# Patient Record
Sex: Female | Born: 1960
Health system: Southern US, Community
[De-identification: ages and names within clinical notes are randomized; demographics above are authoritative.]

## PROBLEM LIST (undated history)

## (undated) DIAGNOSIS — C801 Malignant (primary) neoplasm, unspecified: Secondary | ICD-10-CM

## (undated) HISTORY — PX: ABDOMINAL HYSTERECTOMY: SHX81

---

## 2015-04-12 DIAGNOSIS — R87612 Low grade squamous intraepithelial lesion on cytologic smear of cervix (LGSIL): Secondary | ICD-10-CM | POA: Insufficient documentation

## 2015-07-20 DIAGNOSIS — Z87440 Personal history of urinary (tract) infections: Secondary | ICD-10-CM | POA: Insufficient documentation

## 2017-10-02 DIAGNOSIS — R87810 Cervical high risk human papillomavirus (HPV) DNA test positive: Secondary | ICD-10-CM | POA: Insufficient documentation

## 2017-11-12 DIAGNOSIS — Z803 Family history of malignant neoplasm of breast: Secondary | ICD-10-CM | POA: Insufficient documentation

## 2017-11-28 DIAGNOSIS — Z800 Family history of malignant neoplasm of digestive organs: Secondary | ICD-10-CM | POA: Insufficient documentation

## 2019-03-27 DIAGNOSIS — C541 Malignant neoplasm of endometrium: Secondary | ICD-10-CM | POA: Insufficient documentation

## 2019-03-27 DIAGNOSIS — C530 Malignant neoplasm of endocervix: Secondary | ICD-10-CM | POA: Insufficient documentation

## 2020-03-22 DIAGNOSIS — D2261 Melanocytic nevi of right upper limb, including shoulder: Secondary | ICD-10-CM | POA: Diagnosis not present

## 2020-04-05 DIAGNOSIS — B078 Other viral warts: Secondary | ICD-10-CM | POA: Diagnosis not present

## 2020-04-05 DIAGNOSIS — Z85828 Personal history of other malignant neoplasm of skin: Secondary | ICD-10-CM | POA: Diagnosis not present

## 2020-04-05 DIAGNOSIS — C541 Malignant neoplasm of endometrium: Secondary | ICD-10-CM | POA: Diagnosis not present

## 2020-04-05 DIAGNOSIS — L820 Inflamed seborrheic keratosis: Secondary | ICD-10-CM | POA: Diagnosis not present

## 2020-04-05 DIAGNOSIS — N952 Postmenopausal atrophic vaginitis: Secondary | ICD-10-CM | POA: Diagnosis not present

## 2020-06-02 DIAGNOSIS — Z20822 Contact with and (suspected) exposure to covid-19: Secondary | ICD-10-CM | POA: Diagnosis not present

## 2020-06-02 DIAGNOSIS — Z7184 Encounter for health counseling related to travel: Secondary | ICD-10-CM | POA: Diagnosis not present

## 2020-08-02 DIAGNOSIS — C541 Malignant neoplasm of endometrium: Secondary | ICD-10-CM | POA: Diagnosis not present

## 2020-08-02 DIAGNOSIS — N952 Postmenopausal atrophic vaginitis: Secondary | ICD-10-CM | POA: Diagnosis not present

## 2020-08-02 DIAGNOSIS — N9419 Other specified dyspareunia: Secondary | ICD-10-CM | POA: Diagnosis not present

## 2020-09-06 DIAGNOSIS — Z01419 Encounter for gynecological examination (general) (routine) without abnormal findings: Secondary | ICD-10-CM | POA: Diagnosis not present

## 2020-09-06 DIAGNOSIS — Z779 Other contact with and (suspected) exposures hazardous to health: Secondary | ICD-10-CM | POA: Diagnosis not present

## 2020-09-06 LAB — HM PAP SMEAR: HM Pap smear: NEGATIVE

## 2020-12-13 DIAGNOSIS — C541 Malignant neoplasm of endometrium: Secondary | ICD-10-CM | POA: Diagnosis not present

## 2021-03-07 ENCOUNTER — Emergency Department
Admission: EM | Admit: 2021-03-07 | Discharge: 2021-03-07 | Disposition: A | Discharge status: Home / Self Care | Payer: Self-pay | Source: Home / Self Care

## 2021-03-07 DIAGNOSIS — R030 Elevated blood-pressure reading, without diagnosis of hypertension: Secondary | ICD-10-CM | POA: Diagnosis not present

## 2021-03-07 HISTORY — DX: Malignant (primary) neoplasm, unspecified: C80.1

## 2021-03-07 NOTE — Discharge Instructions (Addendum)
Advised/encouraged patient to check blood pressure twice daily (once in the morning prior to breakfast and once in the evening prior to dinner) and to log measurements for the next 7 days so that new PCP may be able to better evaluate daily blood pressure trends.

## 2021-03-07 NOTE — ED Triage Notes (Addendum)
Pt c/o hypertension which she's noticed since last week after experiencing some dizziness, HA and tingling in feet. At 0750 this am, BP was  160/95 RT in LT arm and 137/90 in RT. Says it did come down when she took it again at [place]. Was feeling some dizziness which prompted her to take her BP. No previous hx of hypertension. Currently does not have a PCP. Has appt to est care next week.

## 2021-03-07 NOTE — ED Provider Notes (Signed)
[facility]    CSN: [id number] Arrival date & time: 03/07/21  0951      History   Chief Complaint Chief Complaint  Patient presents with   Hypertension    HPI Mathea Azlin is a 60 y.o. female.   HPI 60 year old female presents with increased blood pressure for 7 days.  Reports experiencing some dizziness, headache, and tingling in her feet at 750 this morning with BP in the right arm 160/95, then 137/90.  Patient has no history of hypertension, currently does not have PCP; however, has appointment set for next week to establish care with provider.  Past Medical History:  Diagnosis Date   Cancer ([place])     There are no problems to display for this patient.   Past Surgical History:  Procedure Laterality Date   ABDOMINAL HYSTERECTOMY      OB History   No obstetric history on file.      Home Medications    Prior to Admission medications   Medication Sig Start Date End Date Taking? Authorizing Provider  venlafaxine XR (EFFEXOR-XR) 75 MG 24 hr capsule Take by mouth. 02/09/15  Yes [provider]    Family History History reviewed. No pertinent family history.  Social History Social History   Tobacco Use   Smoking status: Never   Smokeless tobacco: Never  Vaping Use   Vaping Use: Never used  Substance Use Topics   Alcohol use: Yes    Comment: occ     Allergies   Tylenol [acetaminophen]   Review of Systems Review of Systems  Cardiovascular:        Elevated blood pressure for 1-2 weeks    Physical Exam Triage Vital Signs ED Triage Vitals  Enc Vitals Group     BP 03/07/21 1013 137/85     Pulse Rate 03/07/21 1013 65     Resp 03/07/21 1013 17     Temp 03/07/21 1013 98.5 F (36.9 C)     Temp Source 03/07/21 1013 Oral     SpO2 03/07/21 1013 97 %     Weight --      Height --      Head Circumference --      Peak Flow --      Pain Score 03/07/21 1010 0     Pain Loc --      Pain Edu? --      Excl. in [place]? --    No data  found.  Updated Vital Signs BP 137/85 (BP Location: Right Arm)   Pulse 65   Temp 98.5 F (36.9 C) (Oral)   Resp 17   SpO2 97%       Physical Exam Vitals and nursing note reviewed.  Constitutional:      General: She is not in acute distress.    Appearance: Normal appearance. She is normal weight. She is not ill-appearing.  HENT:     Head: Normocephalic and atraumatic.     Mouth/Throat:     Mouth: Mucous membranes are moist.     Pharynx: Oropharynx is clear.  Eyes:     Extraocular Movements: Extraocular movements intact.     Conjunctiva/sclera: Conjunctivae normal.     Pupils: Pupils are equal, round, and reactive to light.  Neck:     Comments: No JVD, no bruit Cardiovascular:     Rate and Rhythm: Normal rate and regular rhythm.     Pulses: Normal pulses.     Heart sounds: Normal heart sounds. No murmur  heard.   No friction rub. No gallop.  Pulmonary:     Effort: Pulmonary effort is normal.     Breath sounds: No stridor. No wheezing, rhonchi or rales.  Musculoskeletal:        General: Normal range of motion.     Cervical back: Normal range of motion and neck supple.  Skin:    General: Skin is warm and dry.  Neurological:     General: No focal deficit present.     Mental Status: She is alert and oriented to person, place, and time. Mental status is at baseline.  Psychiatric:        Mood and Affect: Mood normal.        Behavior: Behavior normal.        Thought Content: Thought content normal.     UC Treatments / Results  Labs (all labs ordered are listed, but only abnormal results are displayed) Labs Reviewed - No data to display  EKG   Radiology No results found.  Procedures Procedures (including critical care time)  Medications Ordered in UC Medications - No data to display  Initial Impression / Assessment and Plan / UC Course  I have reviewed the triage vital signs and the nursing notes.  Pertinent labs & imaging results that were available during  my care of the patient were reviewed by me and considered in my medical decision making (see chart for details).     MDM: 1.  Elevated blood pressure reading without diagnosis of hypertension-Advised/encouraged patient to check blood pressure twice daily (once in the morning prior to breakfast and once in the evening prior to dinner) and to log measurements for the next 7 days so that new PCP may be able to better evaluate daily blood pressure trends.  Patient discharged home, hemodynamically stable. Final Clinical Impressions(s) / UC Diagnoses   Final diagnoses:  Elevated blood pressure reading without diagnosis of hypertension     Discharge Instructions      Advised/encouraged patient to check blood pressure twice daily (once in the morning prior to breakfast and once in the evening prior to dinner) and to log measurements for the next 7 days so that new PCP may be able to better evaluate daily blood pressure trends.     ED Prescriptions   None    PDMP not reviewed this encounter.   Eliezer Lofts, [place] 03/07/21 1135

## 2021-03-13 DIAGNOSIS — E785 Hyperlipidemia, unspecified: Secondary | ICD-10-CM | POA: Insufficient documentation

## 2021-03-13 DIAGNOSIS — Z79899 Other long term (current) drug therapy: Secondary | ICD-10-CM | POA: Insufficient documentation

## 2021-03-13 DIAGNOSIS — N951 Menopausal and female climacteric states: Secondary | ICD-10-CM | POA: Insufficient documentation

## 2021-03-14 ENCOUNTER — Encounter: Payer: Self-pay | Admitting: Physician Assistant

## 2021-03-14 ENCOUNTER — Other Ambulatory Visit: Payer: Self-pay

## 2021-03-14 ENCOUNTER — Ambulatory Visit (INDEPENDENT_AMBULATORY_CARE_PROVIDER_SITE_OTHER): Payer: BC Managed Care – PPO | Admitting: Physician Assistant

## 2021-03-14 VITALS — BP 144/78 | HR 56 | Ht 59.0 in | Wt 136.0 lb

## 2021-03-14 DIAGNOSIS — Z780 Asymptomatic menopausal state: Secondary | ICD-10-CM

## 2021-03-14 DIAGNOSIS — Z8262 Family history of osteoporosis: Secondary | ICD-10-CM

## 2021-03-14 DIAGNOSIS — R519 Headache, unspecified: Secondary | ICD-10-CM

## 2021-03-14 DIAGNOSIS — N951 Menopausal and female climacteric states: Secondary | ICD-10-CM

## 2021-03-14 DIAGNOSIS — R202 Paresthesia of skin: Secondary | ICD-10-CM

## 2021-03-14 DIAGNOSIS — I10 Essential (primary) hypertension: Secondary | ICD-10-CM

## 2021-03-14 DIAGNOSIS — Z13220 Encounter for screening for lipoid disorders: Secondary | ICD-10-CM | POA: Diagnosis not present

## 2021-03-14 DIAGNOSIS — Z1211 Encounter for screening for malignant neoplasm of colon: Secondary | ICD-10-CM

## 2021-03-14 DIAGNOSIS — Z131 Encounter for screening for diabetes mellitus: Secondary | ICD-10-CM | POA: Diagnosis not present

## 2021-03-14 DIAGNOSIS — R7989 Other specified abnormal findings of blood chemistry: Secondary | ICD-10-CM

## 2021-03-14 DIAGNOSIS — R42 Dizziness and giddiness: Secondary | ICD-10-CM

## 2021-03-14 DIAGNOSIS — Z1231 Encounter for screening mammogram for malignant neoplasm of breast: Secondary | ICD-10-CM

## 2021-03-14 MED ORDER — HYDROCHLOROTHIAZIDE 12.5 MG PO TABS: 12.5000 mg | ORAL_TABLET | Freq: Every day | ORAL | 1 refills | Status: DC

## 2021-03-14 NOTE — Progress Notes (Signed)
New Patient Office Visit  Subjective:  Patient ID: Kathleen Smith, female    DOB: Jul 23, 1960  Age: 60 y.o. MRN: [id number]  CC: No chief complaint on file.  HPI Kathleen Smith presents to establish care.   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 She has noticed her BP has been elevated for the past 2 weeks. She also had an episode Thursday where she felt weird and had some tingling in hands and feet. She wanted to take a closer look at her health.   She had a hysterectomy. She continues to still have hot flashes.     Past Medical History:  Diagnosis Date   Cancer [facility])     Past Surgical History:  Procedure Laterality Date   ABDOMINAL HYSTERECTOMY     CESAREAN SECTION      Family History  Problem Relation Age of Onset   Hypertension Mother    Hypertension Father    Skin cancer Brother     Social History   Socioeconomic History   Marital status: Single    Spouse name: Not on file   Number of children: Not on file   Years of education: Not on file   Highest education level: Not on file  Occupational History   Not on file  Tobacco Use   Smoking status: Never   Smokeless tobacco: Never  Vaping Use   Vaping Use: Never used  Substance and Sexual Activity   Alcohol use: Yes    Comment: occ   Drug use: Never   Sexual activity: Yes    Partners: Male  Other Topics Concern   Not on file  Social History Narrative   Not on file   Social Determinants of Health   Financial Resource Strain: Not on file  Food Insecurity: Not on file  Transportation Needs: Not on file  Physical Activity: Not on file  Stress: Not on file  Social Connections: Not on file  Intimate Partner Violence: Not on file    ROS Review of Systems  All other systems reviewed and are negative.  Objective:   Today's Vitals: BP (!) 144/78   Pulse (!) 56   Ht '4\' 11"'$  (1.499 m)   Wt 136 lb (61.7 kg)   SpO2 100%   BMI 27.47 kg/m   Physical Exam Vitals reviewed.   Constitutional:      Appearance: Normal appearance. She is obese.  HENT:     Head: Normocephalic.  Neck:     Vascular: No carotid bruit.  Cardiovascular:     Rate and Rhythm: Normal rate and regular rhythm.     Pulses: Normal pulses.     Heart sounds: Normal heart sounds.  Pulmonary:     Effort: Pulmonary effort is normal.     Breath sounds: Normal breath sounds.  Lymphadenopathy:     Cervical: No cervical adenopathy.  Neurological:     General: No focal deficit present.     Mental Status: She is alert and oriented to person, place, and time.  Psychiatric:        Mood and Affect: Mood normal.    Assessment & Plan:  Marland KitchenMarland KitchenDiagnoses and all orders for this visit:  Hot flashes due to menopause  Colon cancer screening -     Cologuard  Visit for screening mammogram -     MM 3D SCREEN BREAST BILATERAL  Frequent headaches -     Hemoglobin A1c  Paresthesias -     TSH -  Hemoglobin A1c -     Cancel: HM DEXA SCAN -     B12 and Folate Panel -     VITAMIN D 25 Hydroxy (Vit-D Deficiency, Fractures) -     DG BONE DENSITY (DXA)  Screening for diabetes mellitus -     COMPLETE METABOLIC PANEL WITH GFR -     Hemoglobin A1c  Screening for lipid disorders -     Lipid Panel w/reflex Direct LDL  Post-menopausal -     VITAMIN D 25 Hydroxy (Vit-D Deficiency, Fractures)  Dizziness  Family history of osteoporosis -     Cancel: HM DEXA SCAN -     DG BONE DENSITY (DXA)  Primary hypertension -     hydrochlorothiazide (HYDRODIURIL) 12.5 MG tablet; Take 1 tablet (12.5 mg total) by mouth daily.  Elevated TSH -     T4, free -     T3, free -     Thyroglobulin Antibodies (Refl) -     Thyroid peroxidase antibody  BP is elevated today.  Started HCTZ 12.'5mg'$  daily.  Follow up in 4 weeks.  Fasting labs ordered.  Mammogram/bone density ordered.  Cologuard ordered.   Takes effexor for hot flashes still has some but medication has really helped with hot flashes and mood.      Follow-up: Return in about 4 weeks (around 04/11/2021) for in office. Kathleen Planas, PA-C

## 2021-03-15 ENCOUNTER — Encounter: Payer: Self-pay | Admitting: Physician Assistant

## 2021-03-15 NOTE — Progress Notes (Signed)
Vadis,   Vitamin D low. Make sure taking at least 2000 units a day.  B12 and folate look good.  A1C normal range, no diabetes or pre-diabetes.  Kidney and liver look great.  LDL not quite to goal and HDL could be higher. 10 year CV risk 4.5 percent so under 7.5 percent that we suggest cholesterol medication. Work on diet and life style changes for now. Recheck in 1 year.  TSH on the low side leaning more towards HYPER active thyroid. JJ please add free T4/T3 and anti tpo and thyroglobin antibodies.

## 2021-03-17 LAB — COMPLETE METABOLIC PANEL WITH GFR
AG Ratio: 2 (calc) (ref 1.0–2.5)
ALT: 11 U/L (ref 6–29)
AST: 15 U/L (ref 10–35)
Albumin: 4.3 g/dL (ref 3.6–5.1)
Alkaline phosphatase (APISO): 84 U/L (ref 37–153)
BUN: 15 mg/dL (ref 7–25)
CO2: 30 mmol/L (ref 20–32)
Calcium: 9.8 mg/dL (ref 8.6–10.4)
Chloride: 104 mmol/L (ref 98–110)
Creat: 0.58 mg/dL (ref 0.50–1.05)
Globulin: 2.2 g/dL (calc) (ref 1.9–3.7)
Glucose, Bld: 100 mg/dL — ABNORMAL HIGH (ref 65–99)
Potassium: 4.3 mmol/L (ref 3.5–5.3)
Sodium: 140 mmol/L (ref 135–146)
Total Bilirubin: 1.1 mg/dL (ref 0.2–1.2)
Total Protein: 6.5 g/dL (ref 6.1–8.1)
eGFR: 104 mL/min/{1.73_m2} (ref >=60)

## 2021-03-17 LAB — THYROID PEROXIDASE ANTIBODY: Thyroperoxidase Ab SerPl-aCnc: 1 IU/mL (ref <9)

## 2021-03-17 LAB — VITAMIN D 25 HYDROXY (VIT D DEFICIENCY, FRACTURES): Vit D, 25-Hydroxy: 26 ng/mL — ABNORMAL LOW (ref 30–100)

## 2021-03-17 LAB — LIPID PANEL W/REFLEX DIRECT LDL
Cholesterol: 187 mg/dL (ref <200)
HDL: 45 mg/dL — ABNORMAL LOW (ref >=50)
LDL Cholesterol (Calc): 121 mg/dL (calc) — ABNORMAL HIGH
Non-HDL Cholesterol (Calc): 142 mg/dL (calc) — ABNORMAL HIGH (ref <130)
Total CHOL/HDL Ratio: 4.2 (calc) (ref <5.0)
Triglycerides: 106 mg/dL (ref <150)

## 2021-03-17 LAB — T4, FREE: Free T4: 1.1 ng/dL (ref 0.8–1.8)

## 2021-03-17 LAB — THYROGLOBULIN ANTIBODIES (REFL): Thyroglobulin Ab: <1 IU/mL (ref <=1)

## 2021-03-17 LAB — HEMOGLOBIN A1C
Hgb A1c MFr Bld: 5.3 % of total Hgb (ref <5.7)
Mean Plasma Glucose: 105 mg/dL
eAG (mmol/L): 5.8 mmol/L

## 2021-03-17 LAB — TSH: TSH: 0.4 mIU/L (ref 0.40–4.50)

## 2021-03-17 LAB — B12 AND FOLATE PANEL
Folate: 16.9 ng/mL
Vitamin B-12: 471 pg/mL (ref 200–1100)

## 2021-03-17 LAB — T3, FREE: T3, Free: 3.3 pg/mL (ref 2.3–4.2)

## 2021-03-19 NOTE — Progress Notes (Signed)
No antibodies to thyroid detected.  Free T4 and T3 in normal ranges. We can continue to monitor and recheck in 6 months for any changes.

## 2021-03-21 ENCOUNTER — Encounter: Payer: Self-pay | Admitting: Physician Assistant

## 2021-03-21 DIAGNOSIS — Z780 Asymptomatic menopausal state: Secondary | ICD-10-CM | POA: Insufficient documentation

## 2021-03-21 DIAGNOSIS — R202 Paresthesia of skin: Secondary | ICD-10-CM | POA: Insufficient documentation

## 2021-03-21 DIAGNOSIS — R519 Headache, unspecified: Secondary | ICD-10-CM | POA: Insufficient documentation

## 2021-03-21 DIAGNOSIS — R42 Dizziness and giddiness: Secondary | ICD-10-CM | POA: Insufficient documentation

## 2021-04-05 ENCOUNTER — Ambulatory Visit: Payer: BC Managed Care – PPO

## 2021-04-05 ENCOUNTER — Other Ambulatory Visit: Payer: BC Managed Care – PPO

## 2021-04-17 ENCOUNTER — Ambulatory Visit: Payer: BC Managed Care – PPO | Admitting: Physician Assistant

## 2021-05-02 ENCOUNTER — Ambulatory Visit: Payer: BC Managed Care – PPO | Admitting: Physician Assistant

## 2021-05-17 ENCOUNTER — Ambulatory Visit (INDEPENDENT_AMBULATORY_CARE_PROVIDER_SITE_OTHER): Payer: BC Managed Care – PPO

## 2021-05-17 ENCOUNTER — Other Ambulatory Visit: Payer: Self-pay

## 2021-05-17 DIAGNOSIS — Z1231 Encounter for screening mammogram for malignant neoplasm of breast: Secondary | ICD-10-CM

## 2021-05-17 DIAGNOSIS — Z8262 Family history of osteoporosis: Secondary | ICD-10-CM | POA: Diagnosis not present

## 2021-05-17 DIAGNOSIS — Z780 Asymptomatic menopausal state: Secondary | ICD-10-CM | POA: Diagnosis not present

## 2021-05-17 DIAGNOSIS — M8589 Other specified disorders of bone density and structure, multiple sites: Secondary | ICD-10-CM | POA: Diagnosis not present

## 2021-05-17 DIAGNOSIS — R202 Paresthesia of skin: Secondary | ICD-10-CM | POA: Diagnosis not present

## 2021-05-17 NOTE — Progress Notes (Signed)
Normal mammogram. Follow up in 1 year.

## 2021-05-18 ENCOUNTER — Encounter: Payer: Self-pay | Admitting: Physician Assistant

## 2021-05-18 DIAGNOSIS — M8580 Other specified disorders of bone density and structure, unspecified site: Secondary | ICD-10-CM | POA: Insufficient documentation

## 2021-05-18 NOTE — Progress Notes (Signed)
Bone density shows osteopenia, low bone mass, but very close to osteoporosis. Make sure you are taking vitamin D and calcium daily and low weight bearing exercises.

## 2021-05-29 ENCOUNTER — Encounter: Payer: Self-pay | Admitting: Physician Assistant

## 2021-05-29 ENCOUNTER — Ambulatory Visit: Payer: BC Managed Care – PPO | Admitting: Physician Assistant

## 2021-05-29 ENCOUNTER — Other Ambulatory Visit: Payer: Self-pay

## 2021-05-29 VITALS — BP 124/68 | HR 64 | Temp 98.3°F | Ht 59.0 in | Wt 136.0 lb

## 2021-05-29 DIAGNOSIS — R7989 Other specified abnormal findings of blood chemistry: Secondary | ICD-10-CM

## 2021-05-29 DIAGNOSIS — I10 Essential (primary) hypertension: Secondary | ICD-10-CM | POA: Diagnosis not present

## 2021-05-29 DIAGNOSIS — R42 Dizziness and giddiness: Secondary | ICD-10-CM

## 2021-05-29 DIAGNOSIS — N951 Menopausal and female climacteric states: Secondary | ICD-10-CM

## 2021-05-29 DIAGNOSIS — M8580 Other specified disorders of bone density and structure, unspecified site: Secondary | ICD-10-CM

## 2021-05-29 DIAGNOSIS — Z780 Asymptomatic menopausal state: Secondary | ICD-10-CM

## 2021-05-29 DIAGNOSIS — R519 Headache, unspecified: Secondary | ICD-10-CM

## 2021-05-29 DIAGNOSIS — R202 Paresthesia of skin: Secondary | ICD-10-CM

## 2021-05-29 DIAGNOSIS — Z114 Encounter for screening for human immunodeficiency virus [HIV]: Secondary | ICD-10-CM

## 2021-05-29 DIAGNOSIS — Z1159 Encounter for screening for other viral diseases: Secondary | ICD-10-CM

## 2021-05-29 NOTE — Progress Notes (Signed)
   Subjective:    Patient ID: Kathleen Smith, female    DOB: [birth year], 60 y.o.   MRN: [id number]  HPI Pt is here for one month follow up and review of labs and testing.   She is doing well. No chest pains, palpitations, headaches, vision changes, paresthesia.  She has not been taking her hydrochlorothiazide.  Her blood pressure readings are mostly in the 120s-130s over 80s.  She has had very few blood pressure readings above 140/90.  .. 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   Family history of breast cancer 11/12/2017   Family history of malignant neoplasm of pancreas 11/28/2017   History of recurrent UTIs 07/20/2015   Low grade squamous intraepithelial lesion on cytologic smear of cervix (LGSIL) 04/12/2015   Hot flashes due to menopause 03/13/2021   Dyslipidemia (high LDL; low HDL) 03/13/2021   Frequent headaches 03/21/2021   Paresthesias 03/21/2021   Post-menopausal 03/21/2021   Dizziness 03/21/2021   Osteopenia 05/18/2021   Resolved Ambulatory Problems    Diagnosis Date Noted   No Resolved Ambulatory Problems   Past Medical History:  Diagnosis Date   Cancer [facility])      Review of Systems  All other systems reviewed and are negative.     Objective:   Physical Exam Vitals reviewed.  Constitutional:      Appearance: Normal appearance.  HENT:     Head: Normocephalic.  Neck:     Vascular: No carotid bruit.  Cardiovascular:     Rate and Rhythm: Normal rate and regular rhythm.     Pulses: Normal pulses.  Pulmonary:     Effort: Pulmonary effort is normal.     Breath sounds: Normal breath sounds.  Neurological:     General: No focal deficit present.     Mental Status: She is alert and oriented to person, place, and time.  Psychiatric:        Mood and  Affect: Mood normal.          Assessment & Plan:  Marland KitchenMarland KitchenEaster was seen today for hypertension, hypothyroidism and dizziness.  Diagnoses and all orders for this visit:  Primary hypertension  Elevated TSH  Dizziness  Paresthesias  Hot flashes due to menopause  Frequent headaches  Post-menopausal  Need for hepatitis C screening test -     Hepatitis C antibody  Screening for HIV (human immunodeficiency virus) -     HIV Antibody (routine testing w rflx)  Osteopenia, unspecified location  Blood pressure looks great today.  Took hydrochlorothiazide off her medication list. Discussed ways to keep BP low and to goal.   Reviewed labs.  LDL was 121. CV 10 year risk 4.5 percent. Declined statin.   Discussed osteopenia on her bone density testing.  Encourage patient to start exercising regularly with weight bearing exercise.  Encouraged daily vitamin D and calcium.  We will recheck in 2 years.  Follow-up in 6 months.

## 2021-06-06 DIAGNOSIS — M6289 Other specified disorders of muscle: Secondary | ICD-10-CM | POA: Diagnosis not present

## 2021-06-06 DIAGNOSIS — C541 Malignant neoplasm of endometrium: Secondary | ICD-10-CM | POA: Diagnosis not present

## 2021-06-06 DIAGNOSIS — N941 Unspecified dyspareunia: Secondary | ICD-10-CM | POA: Insufficient documentation

## 2021-07-26 DIAGNOSIS — M62838 Other muscle spasm: Secondary | ICD-10-CM | POA: Diagnosis not present

## 2021-07-26 DIAGNOSIS — N941 Unspecified dyspareunia: Secondary | ICD-10-CM | POA: Diagnosis not present

## 2021-07-26 DIAGNOSIS — M6281 Muscle weakness (generalized): Secondary | ICD-10-CM | POA: Diagnosis not present

## 2021-07-26 DIAGNOSIS — M6289 Other specified disorders of muscle: Secondary | ICD-10-CM | POA: Diagnosis not present

## 2021-08-02 ENCOUNTER — Encounter: Payer: Self-pay | Admitting: Physician Assistant

## 2021-10-11 DIAGNOSIS — C44622 Squamous cell carcinoma of skin of right upper limb, including shoulder: Secondary | ICD-10-CM | POA: Diagnosis not present

## 2021-10-11 DIAGNOSIS — C44519 Basal cell carcinoma of skin of other part of trunk: Secondary | ICD-10-CM | POA: Diagnosis not present

## 2021-10-11 DIAGNOSIS — L821 Other seborrheic keratosis: Secondary | ICD-10-CM | POA: Diagnosis not present

## 2021-10-11 DIAGNOSIS — D485 Neoplasm of uncertain behavior of skin: Secondary | ICD-10-CM | POA: Diagnosis not present

## 2021-10-11 DIAGNOSIS — L570 Actinic keratosis: Secondary | ICD-10-CM | POA: Diagnosis not present

## 2021-10-11 DIAGNOSIS — L98499 Non-pressure chronic ulcer of skin of other sites with unspecified severity: Secondary | ICD-10-CM | POA: Diagnosis not present

## 2021-10-11 DIAGNOSIS — L820 Inflamed seborrheic keratosis: Secondary | ICD-10-CM | POA: Diagnosis not present

## 2021-10-11 DIAGNOSIS — L578 Other skin changes due to chronic exposure to nonionizing radiation: Secondary | ICD-10-CM | POA: Diagnosis not present

## 2021-10-11 DIAGNOSIS — D0359 Melanoma in situ of other part of trunk: Secondary | ICD-10-CM | POA: Diagnosis not present

## 2021-10-25 ENCOUNTER — Encounter: Payer: Self-pay | Admitting: Physician Assistant

## 2021-10-25 DIAGNOSIS — C439 Malignant melanoma of skin, unspecified: Secondary | ICD-10-CM | POA: Insufficient documentation

## 2021-11-29 ENCOUNTER — Ambulatory Visit: Payer: BC Managed Care – PPO | Admitting: Physician Assistant

## 2021-11-29 VITALS — BP 137/69 | HR 60 | Ht 59.0 in | Wt 132.0 lb

## 2021-11-29 DIAGNOSIS — Z131 Encounter for screening for diabetes mellitus: Secondary | ICD-10-CM | POA: Diagnosis not present

## 2021-11-29 DIAGNOSIS — C439 Malignant melanoma of skin, unspecified: Secondary | ICD-10-CM

## 2021-11-29 DIAGNOSIS — M8580 Other specified disorders of bone density and structure, unspecified site: Secondary | ICD-10-CM

## 2021-11-29 DIAGNOSIS — E663 Overweight: Secondary | ICD-10-CM

## 2021-11-29 DIAGNOSIS — I10 Essential (primary) hypertension: Secondary | ICD-10-CM | POA: Diagnosis not present

## 2021-11-29 DIAGNOSIS — R7989 Other specified abnormal findings of blood chemistry: Secondary | ICD-10-CM

## 2021-11-29 DIAGNOSIS — E559 Vitamin D deficiency, unspecified: Secondary | ICD-10-CM

## 2021-11-29 NOTE — Patient Instructions (Addendum)
$'1200mg'f$  calcium 2000units vitamin D 30-60g of protein   Lifespan Screening osteostrong  Osteopenia  Osteopenia is a loss of thickness (density) inside the bones. Another name for osteopenia is low bone mass. Mild osteopenia is a normal part of aging. It is not a disease, and it does not cause symptoms. However, if you have osteopenia and continue to lose bone mass, you could develop a condition that causes the bones to become thin and break more easily (osteoporosis). Osteoporosis can cause you to lose some height, have back pain, and have a stooped posture. Although osteopenia is not a disease, making changes to your lifestyle and diet can help to prevent osteopenia from developing into osteoporosis. What are the causes? Osteopenia is caused by loss of calcium in the bones. Bones are constantly changing. Old bone cells are continually being replaced with new bone cells. This process builds new bone. The mineral calcium is needed to build new bone and maintain bone density. Bone density is usually highest around age 58. After that, most people's bodies cannot replace all the bone they have lost with new bone. What increases the risk? You are more likely to develop this condition if: You are older than age 78. You are a woman who went through menopause early. You have a long illness that keeps you in bed. You do not get enough exercise. You lack certain nutrients (malnutrition). You have an overactive thyroid gland (hyperthyroidism). You use products that contain nicotine or tobacco, such as cigarettes, e-cigarettes and chewing tobacco, or you drink a lot of alcohol. You are taking medicines that weaken the bones, such as steroids. What are the signs or symptoms? This condition does not cause any symptoms. You may have a slightly higher risk for bone breaks (fractures), so getting fractures more easily than normal may be an indication of osteopenia. How is this diagnosed? This condition may  be diagnosed based on an X-ray exam that measures bone density (dual-energy X-ray absorptiometry, or DEXA). This test can measure bone density in your hips, spine, and wrists. Osteopenia has no symptoms, so this condition is usually diagnosed after a routine bone density screening test is done for osteoporosis. This routine screening is usually done for: Women who are age 7 or older. Men who are age 57 or older. If you have risk factors for osteopenia, you may have the screening test at an earlier age. How is this treated? Making dietary and lifestyle changes can lower your risk for osteoporosis. If you have severe osteopenia that is close to becoming osteoporosis, this condition can be treated with medicines and dietary supplements such as calcium and vitamin D. These supplements help to rebuild bone density. Follow these instructions at home: Eating and drinking Eat a diet that is high in calcium and vitamin D. Calcium is found in dairy products, beans, salmon, and leafy green vegetables like spinach and broccoli. Look for foods that have vitamin D and calcium added to them (fortified foods), such as orange juice, cereal, and bread.  Lifestyle Do 30 minutes or more of a weight-bearing exercise every day, such as walking, jogging, or playing a sport. These types of exercises strengthen the bones. Do not use any products that contain nicotine or tobacco, such as cigarettes, e-cigarettes, and chewing tobacco. If you need help quitting, ask your health care provider. Do not drink alcohol if: Your health care provider tells you not to drink. You are pregnant, may be pregnant, or are planning to become pregnant. If you drink  alcohol: Limit how much you use to: 0-1 drink a day for women. 0-2 drinks a day for men. Be aware of how much alcohol is in your drink. In the [place] one drink equals one 12 oz bottle of beer (355 mL), one 5 oz glass of wine (148 mL), or one 1 oz glass of hard liquor (44  mL). General instructions Take over-the-counter and prescription medicines only as told by your health care provider. These include vitamins and supplements. Take precautions at home to lower your risk of falling, such as: Keeping rooms well-lit and free of clutter, such as cords. Installing safety rails on stairs. Using rubber mats in the bathroom or other areas that are often wet or slippery. Keep all follow-up visits. This is important. Contact a health care provider if: You have not had a bone density screening for osteoporosis and you are: A woman who is age 33 or older. A man who is age 59 or older. You are a postmenopausal woman who has not had a bone density screening for osteoporosis. You are older than age 17 and you want to know if you should have bone density screening for osteoporosis. Summary Osteopenia is a loss of thickness (density) inside the bones. Another name for osteopenia is low bone mass. Osteopenia is not a disease, but it may increase your risk for a condition that causes the bones to become thin and break more easily (osteoporosis). You may be at risk for osteopenia if you are older than age 29 or if you are a woman who went through early menopause. Osteopenia does not cause any symptoms, but it can be diagnosed with a bone density screening test. Dietary and lifestyle changes are the first treatment for osteopenia. These may lower your risk for osteoporosis. This information is not intended to replace advice given to you by your health care provider. Make sure you discuss any questions you have with your health care provider. Document Revised: 12/03/2019 Document Reviewed: 12/03/2019 Elsevier Patient Education  [employer].

## 2021-11-29 NOTE — Progress Notes (Unsigned)
Established Patient Office Visit  Subjective   Patient ID: Kathleen Smith, female    DOB: 02-13-1961  Age: 61 y.o. MRN: [id number]  Chief Complaint  Patient presents with   Hypertension    HPI Pt is a 61 yo obese female who presents to the clinic to follow up on HTN.   She is doing optivia and lost 9lbs. She is walking more and eating less. No CP, palpitations, headaches, vision changes.   She wants to discuss osteopenia.   She wants to have test for insulin resistance. Her labs recently done showed normal A1C and BS.   Seen by dermatology and dx with melanoma in situ.   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    Objective:     BP 137/69   Pulse 60   Ht '4\' 11"'$  (1.499 m)   Wt 132 lb (59.9 kg)   SpO2 100%   BMI 26.66 kg/m  BP Readings from Last 3 Encounters:  11/29/21 137/69  05/29/21 124/68  03/14/21 (!) 144/78   Wt Readings from Last 3 Encounters:  11/29/21 132 lb (59.9 kg)  05/29/21 136 lb (61.7 kg)   03/14/21 136 lb (61.7 kg)      Physical Exam Vitals reviewed.  Constitutional:      Appearance: Normal appearance.  HENT:     Head: Normocephalic.  Cardiovascular:     Rate and Rhythm: Normal rate.     Pulses: Normal pulses.  Pulmonary:     Effort: Pulmonary effort is normal.  Musculoskeletal:     Right lower leg: No edema.     Left lower leg: No edema.  Neurological:     General: No focal deficit present.     Mental Status: She is alert and oriented to person, place, and time.  Psychiatric:        Mood and Affect: Mood normal.       Assessment & Plan:  Marland KitchenMarland KitchenKeyarah was seen today for hypertension.  Diagnoses and all orders for this visit:  Primary hypertension -     COMPLETE METABOLIC PANEL WITH GFR  Elevated TSH -     TSH -     T4, free -     T3, free  Melanoma of skin ([facility])  Screening for diabetes mellitus -     Insulin and C-Peptide -     Insulin, random  Vitamin D insufficiency -     VITAMIN D 25 Hydroxy (Vit-D Deficiency, Fractures)  Osteopenia, unspecified location   BP looks better but borderline Continue to lose weight and limit salt Follow  up in 6-12 months  Recheck TSH/vitamin D/insulin resistance  Discussed osteopenia Continue on vitamin D 2000 units and calcium '1200mg'$  Discussed importance of exercise and low weight bearing exercise HO given  Discussed osteostrong   Return in about 1 year (around 11/30/2022) for at [facility].    Iran Planas, PA-C

## 2021-11-30 ENCOUNTER — Encounter: Payer: Self-pay | Admitting: Physician Assistant

## 2021-11-30 DIAGNOSIS — E663 Overweight: Secondary | ICD-10-CM | POA: Insufficient documentation

## 2021-12-06 DIAGNOSIS — D0359 Melanoma in situ of other part of trunk: Secondary | ICD-10-CM | POA: Diagnosis not present

## 2021-12-12 DIAGNOSIS — N941 Unspecified dyspareunia: Secondary | ICD-10-CM | POA: Diagnosis not present

## 2021-12-12 DIAGNOSIS — C541 Malignant neoplasm of endometrium: Secondary | ICD-10-CM | POA: Diagnosis not present

## 2022-01-17 ENCOUNTER — Encounter: Payer: Self-pay | Admitting: Physician Assistant

## 2022-01-17 MED ORDER — VENLAFAXINE HCL ER 75 MG PO CP24: 75.0000 mg | ORAL_CAPSULE | Freq: Every day | ORAL | 1 refills | Status: DC

## 2022-01-22 ENCOUNTER — Ambulatory Visit: Payer: BC Managed Care – PPO | Admitting: Family Medicine

## 2022-01-22 VITALS — BP 131/86 | HR 65 | Temp 98.5°F | Ht 59.0 in | Wt 125.0 lb

## 2022-01-22 DIAGNOSIS — B309 Viral conjunctivitis, unspecified: Secondary | ICD-10-CM

## 2022-01-22 NOTE — Assessment & Plan Note (Signed)
-   Discussed with patient that this is most likely viral since she has not had any matting or purulent discharge from both eyes -We will go ahead and treat with Zyrtec daily and over-the-counter allergy eyedrops.  Patient said her sister takes a certain allergy eyedrop and she will get back to me if the prescription is needed for this -Also discussed if patient begins having purulent discharge or matting from 1 or both eyes to let me know and we will go ahead and start treatment with Cipro drops. -Advised patient to wear glasses for the time being until her conjunctivitis clears up

## 2022-01-22 NOTE — Progress Notes (Signed)
Acute Office Visit  Subjective:     Patient ID: Kathleen Smith, female    DOB: 1961-01-08, 61 y.o.   MRN: [id number]  Chief Complaint  Patient presents with   Eye Problem    Started Saturday  Right eye - red, swollen, irritated Sunday - left eye started having some issues No vision changes    61 year old female presents with bilateral eye irritation and redness for three days. Admits to dry secretions in the morning. Denies any purulent discharge. She has tried OTC eye drops which didn't help much. She wears daily contacts. Does have seasonal allergies, but does not take anything daily. She did have a cold a week ago but did not have anything.     Review of Systems  Eyes:  Positive for redness. Negative for blurred vision.  Respiratory:  Negative for cough and shortness of breath.   Cardiovascular:  Negative for chest pain.        Objective:    BP 131/86   Pulse 65   Temp 98.5 F (36.9 C) (Oral)   Ht '4\' 11"'$  (1.499 m)   Wt 125 lb (56.7 kg)   SpO2 96%   BMI 25.25 kg/m    Physical Exam Vitals and nursing note reviewed.  Constitutional:      General: She is not in acute distress.    Appearance: Normal appearance.  HENT:     Head: Normocephalic and atraumatic.     Right Ear: External ear normal.     Left Ear: External ear normal.     Nose: Nose normal.  Eyes:     General:        Right eye: No discharge.        Left eye: No discharge.     Comments: Bilateral conjunctival erythematous injection  Cardiovascular:     Rate and Rhythm: Normal rate and regular rhythm.  Pulmonary:     Effort: Pulmonary effort is normal.     Breath sounds: Normal breath sounds.  Neurological:     General: No focal deficit present.     Mental Status: She is alert and oriented to person, place, and time.     Comments: Cranial nerves II to XII intact bilaterally  Psychiatric:        Mood and Affect: Mood normal.        Behavior: Behavior normal.        Thought Content: Thought  content normal.        Judgment: Judgment normal.     No results found for any visits on 01/22/22.      Assessment & Plan:   Problem List Items Addressed This Visit       Other   Acute viral conjunctivitis of both eyes - Primary    - Discussed with patient that this is most likely viral since she has not had any matting or purulent discharge from both eyes -We will go ahead and treat with Zyrtec daily and over-the-counter allergy eyedrops.  Patient said her sister takes a certain allergy eyedrop and she will get back to me if the prescription is needed for this -Also discussed if patient begins having purulent discharge or matting from 1 or both eyes to let me know and we will go ahead and start treatment with Cipro drops. -Advised patient to wear glasses for the time being until her conjunctivitis clears up       No orders of the defined types were placed in this encounter.  No follow-ups on file.  Owens Loffler, DO

## 2022-02-22 IMAGING — MG MM DIGITAL SCREENING BILAT W/ TOMO AND CAD
8 series · 9 of 24 positions shown · non-contrast
Comparison: Previous exam(s).

CLINICAL DATA: Screening.

EXAM:
DIGITAL SCREENING BILATERAL MAMMOGRAM WITH TOMOSYNTHESIS AND CAD
TECHNIQUE: Bilateral screening digital craniocaudal and mediolateral oblique
mammograms were obtained. Bilateral screening digital breast
tomosynthesis was performed. The images were evaluated with
computer-aided detection.

[L CC synth-2D]
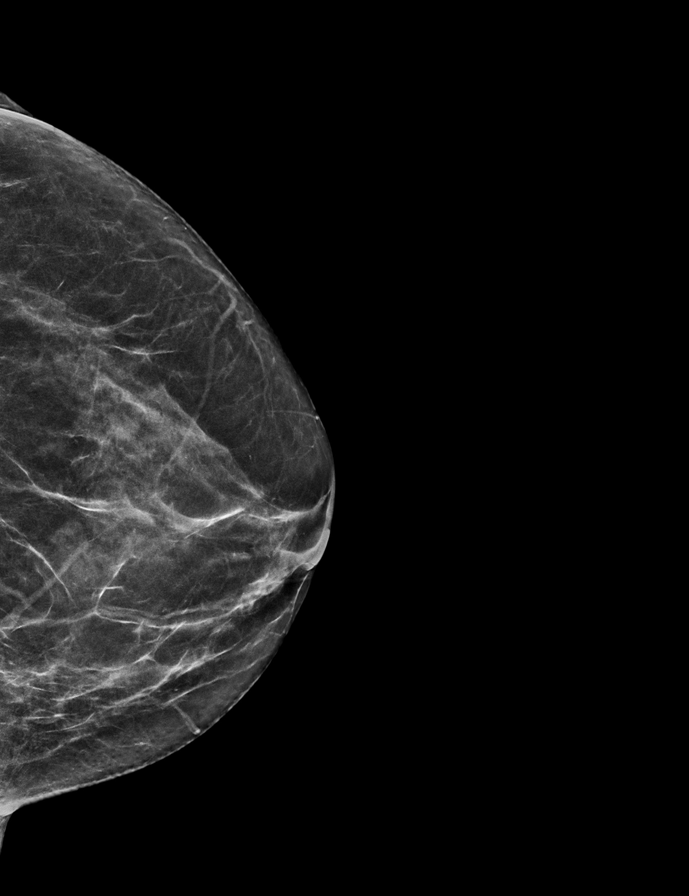

[R MLO synth-2D]
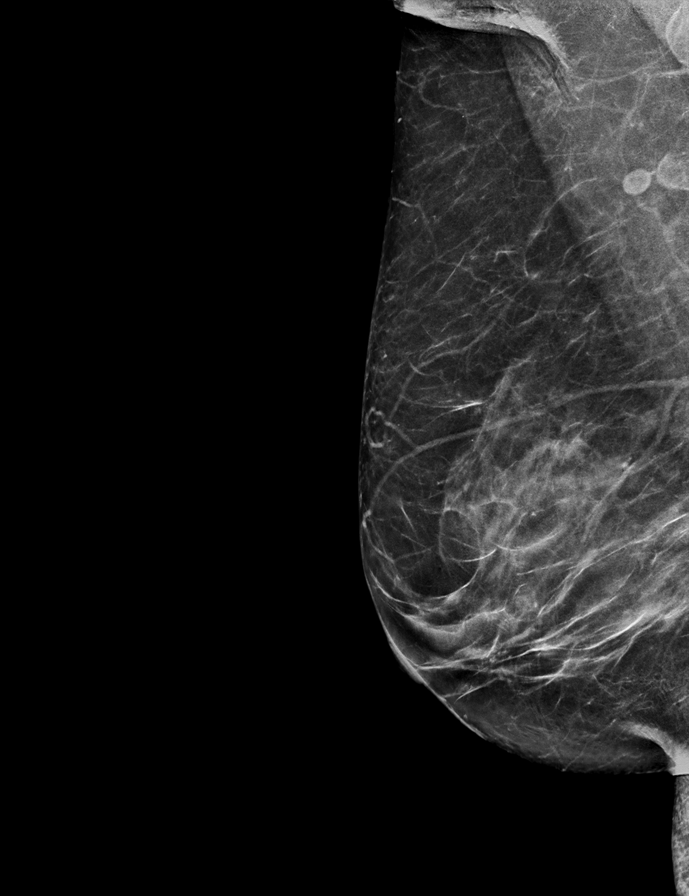

[L MLO synth-2D]
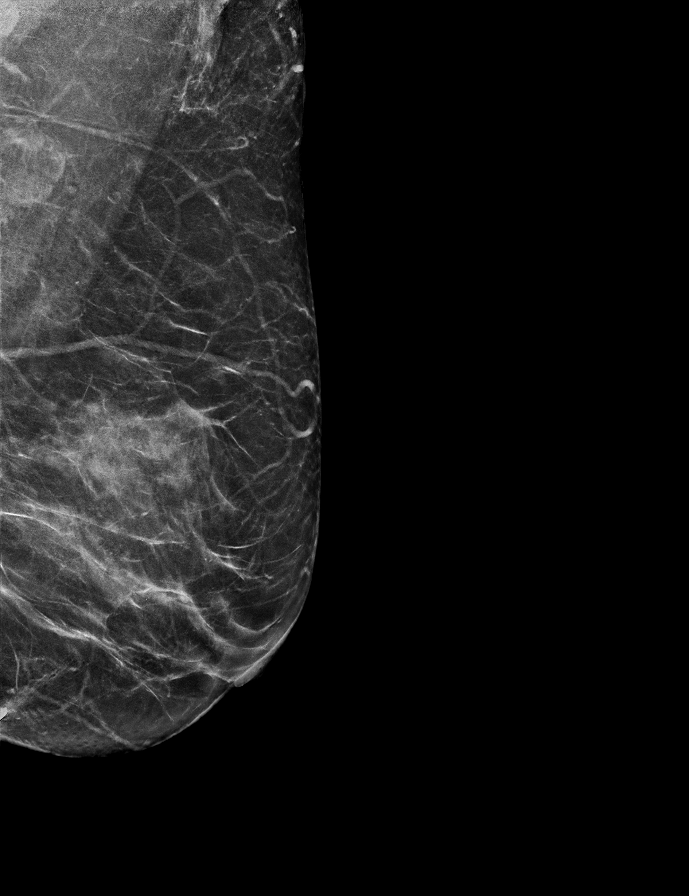

[R CC synth-2D]
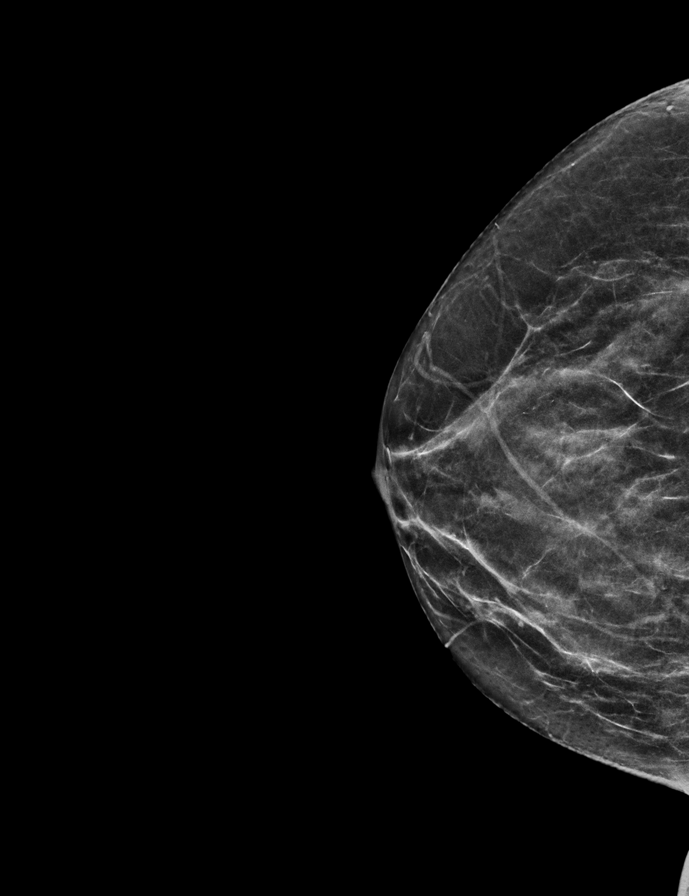

[R CC tomo · 2 of 48 frames shown]
[frame 16/48]
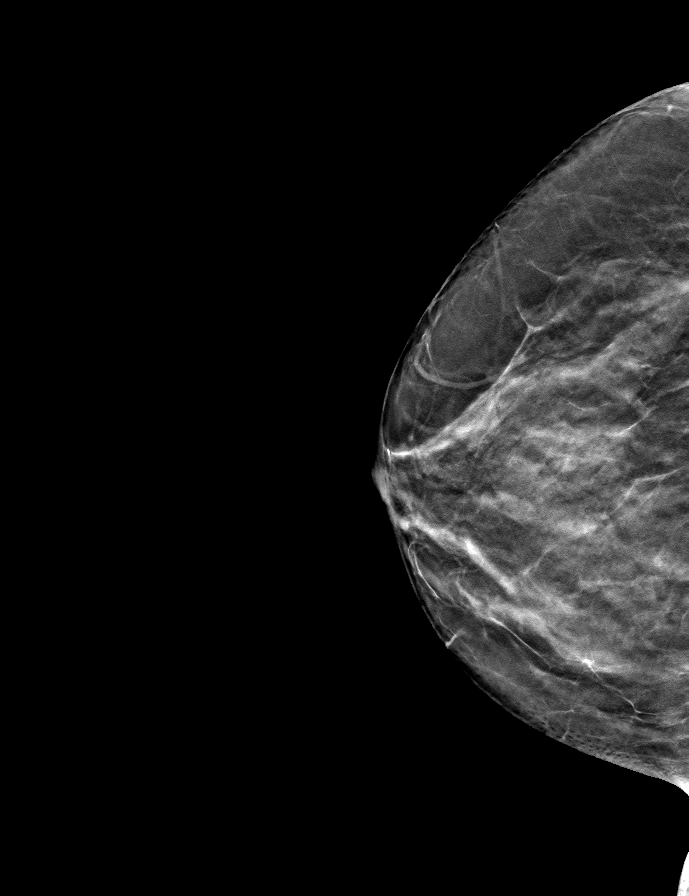
[frame 25/48]
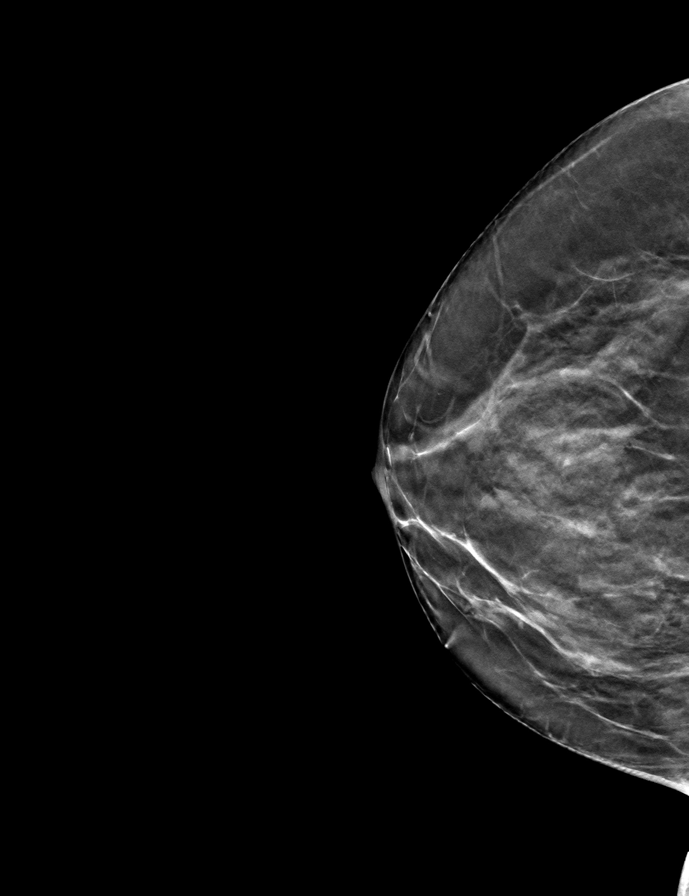

[L CC tomo · tomo slice 26/51.0]
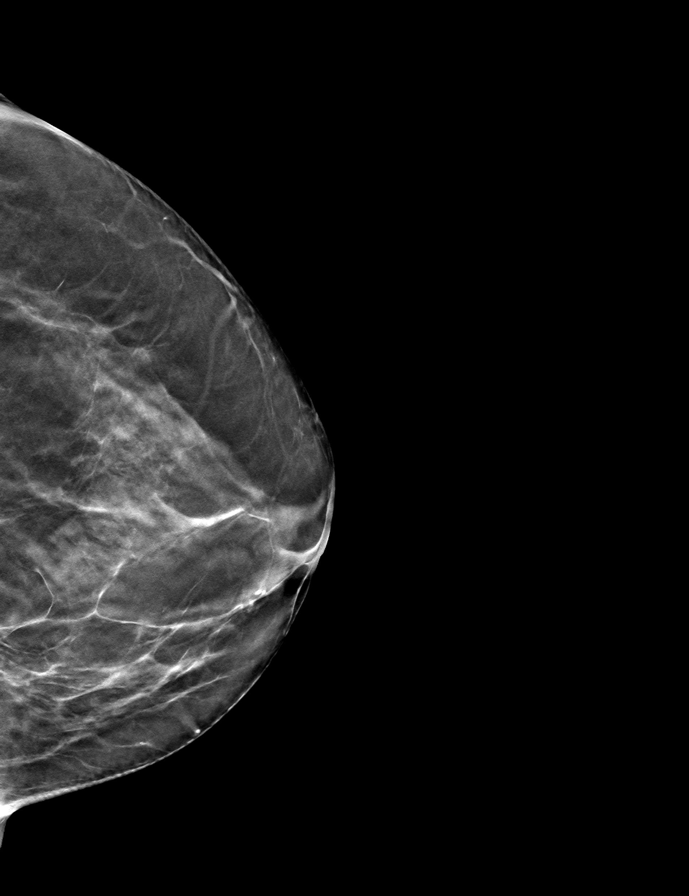

[L MLO tomo · tomo slice 29/58.0]
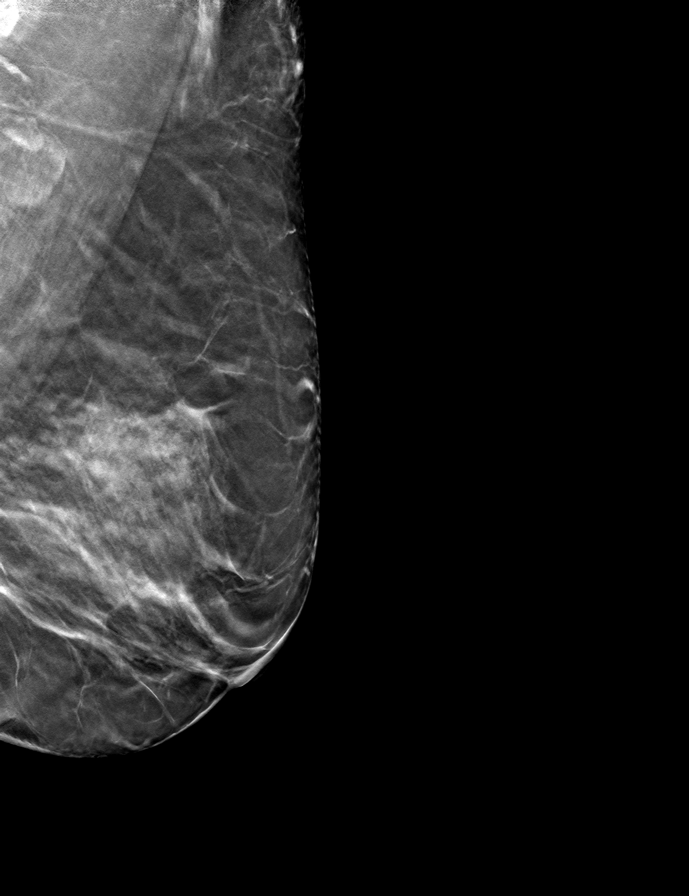

[R MLO tomo · tomo slice 30/59.0]
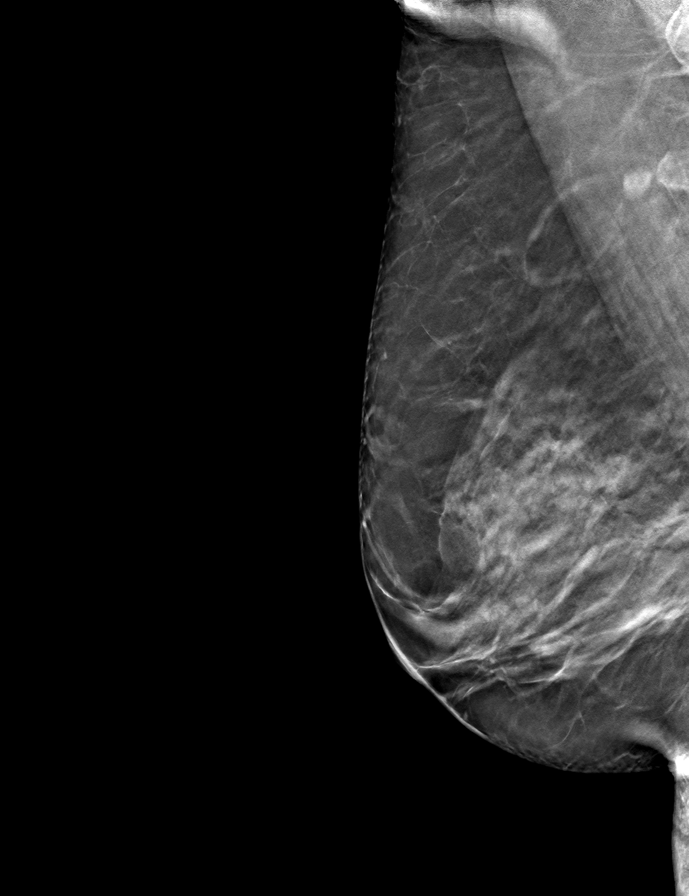

[9 of 24 positions shown; findings below may reference images not displayed]

ACR Breast Density Category c: The breast tissue is heterogeneously
dense, which may obscure small masses.
FINDINGS: There are no findings suspicious for malignancy.
IMPRESSION: No mammographic evidence of malignancy. A result letter of this
screening mammogram will be mailed directly to the patient.

RECOMMENDATION:
Screening mammogram in one year. (Code:[id number])

BI-RADS CATEGORY  1: Negative.

## 2022-02-28 DIAGNOSIS — C44212 Basal cell carcinoma of skin of right ear and external auricular canal: Secondary | ICD-10-CM | POA: Diagnosis not present

## 2022-02-28 DIAGNOSIS — D0359 Melanoma in situ of other part of trunk: Secondary | ICD-10-CM | POA: Diagnosis not present

## 2022-02-28 DIAGNOSIS — D225 Melanocytic nevi of trunk: Secondary | ICD-10-CM | POA: Diagnosis not present

## 2022-02-28 DIAGNOSIS — Z85820 Personal history of malignant melanoma of skin: Secondary | ICD-10-CM | POA: Diagnosis not present

## 2022-02-28 DIAGNOSIS — L821 Other seborrheic keratosis: Secondary | ICD-10-CM | POA: Diagnosis not present

## 2022-02-28 DIAGNOSIS — L570 Actinic keratosis: Secondary | ICD-10-CM | POA: Diagnosis not present

## 2022-07-17 DIAGNOSIS — Z8542 Personal history of malignant neoplasm of other parts of uterus: Secondary | ICD-10-CM | POA: Diagnosis not present

## 2022-07-17 DIAGNOSIS — Z758 Other problems related to medical facilities and other health care: Secondary | ICD-10-CM | POA: Diagnosis not present

## 2022-07-17 DIAGNOSIS — N941 Unspecified dyspareunia: Secondary | ICD-10-CM | POA: Diagnosis not present

## 2022-07-17 DIAGNOSIS — N952 Postmenopausal atrophic vaginitis: Secondary | ICD-10-CM | POA: Diagnosis not present

## 2022-07-30 ENCOUNTER — Other Ambulatory Visit: Payer: Self-pay | Admitting: Physician Assistant

## 2022-07-30 DIAGNOSIS — Z1231 Encounter for screening mammogram for malignant neoplasm of breast: Secondary | ICD-10-CM

## 2022-08-04 ENCOUNTER — Other Ambulatory Visit: Payer: Self-pay | Admitting: Physician Assistant

## 2022-08-20 ENCOUNTER — Other Ambulatory Visit: Payer: Self-pay | Admitting: Physician Assistant

## 2022-08-29 ENCOUNTER — Ambulatory Visit: Payer: BC Managed Care – PPO

## 2022-09-12 DIAGNOSIS — L821 Other seborrheic keratosis: Secondary | ICD-10-CM | POA: Diagnosis not present

## 2022-09-12 DIAGNOSIS — D225 Melanocytic nevi of trunk: Secondary | ICD-10-CM | POA: Diagnosis not present

## 2022-09-12 DIAGNOSIS — Z85820 Personal history of malignant melanoma of skin: Secondary | ICD-10-CM | POA: Diagnosis not present

## 2022-09-12 DIAGNOSIS — L578 Other skin changes due to chronic exposure to nonionizing radiation: Secondary | ICD-10-CM | POA: Diagnosis not present

## 2022-09-19 ENCOUNTER — Ambulatory Visit (INDEPENDENT_AMBULATORY_CARE_PROVIDER_SITE_OTHER): Payer: BC Managed Care – PPO

## 2022-09-19 DIAGNOSIS — Z1231 Encounter for screening mammogram for malignant neoplasm of breast: Secondary | ICD-10-CM | POA: Diagnosis not present

## 2022-09-21 NOTE — Progress Notes (Signed)
Normal mammogram. Follow up in 1 year.

## 2022-12-03 ENCOUNTER — Encounter: Payer: BC Managed Care – PPO | Admitting: Physician Assistant

## 2022-12-10 ENCOUNTER — Encounter: Payer: Self-pay | Admitting: Physician Assistant

## 2022-12-10 ENCOUNTER — Ambulatory Visit (INDEPENDENT_AMBULATORY_CARE_PROVIDER_SITE_OTHER): Payer: BC Managed Care – PPO | Admitting: Physician Assistant

## 2022-12-10 VITALS — BP 142/84 | HR 60 | Ht 59.0 in | Wt 137.0 lb

## 2022-12-10 DIAGNOSIS — Z1159 Encounter for screening for other viral diseases: Secondary | ICD-10-CM

## 2022-12-10 DIAGNOSIS — Z13220 Encounter for screening for lipoid disorders: Secondary | ICD-10-CM

## 2022-12-10 DIAGNOSIS — R030 Elevated blood-pressure reading, without diagnosis of hypertension: Secondary | ICD-10-CM | POA: Diagnosis not present

## 2022-12-10 DIAGNOSIS — Z23 Encounter for immunization: Secondary | ICD-10-CM

## 2022-12-10 DIAGNOSIS — Z780 Asymptomatic menopausal state: Secondary | ICD-10-CM

## 2022-12-10 DIAGNOSIS — Z131 Encounter for screening for diabetes mellitus: Secondary | ICD-10-CM

## 2022-12-10 DIAGNOSIS — Z1211 Encounter for screening for malignant neoplasm of colon: Secondary | ICD-10-CM

## 2022-12-10 DIAGNOSIS — Z0000 Encounter for general adult medical examination without abnormal findings: Secondary | ICD-10-CM | POA: Diagnosis not present

## 2022-12-10 DIAGNOSIS — N951 Menopausal and female climacteric states: Secondary | ICD-10-CM

## 2022-12-10 MED ORDER — VENLAFAXINE HCL ER 75 MG PO CP24: 75.0000 mg | ORAL_CAPSULE | Freq: Every day | ORAL | 1 refills | Status: DC

## 2022-12-10 NOTE — Progress Notes (Signed)
Complete physical exam  Patient: Kathleen Smith   DOB: 01-Sep-1960   62 y.o. Female  MRN: [id number]  Subjective:    Chief Complaint  Patient presents with   Annual Exam    Kathleen Smith is a 62 y.o. female who presents today for a complete physical exam. She reports consuming a general diet. The patient does not participate in regular exercise at present. She generally feels well. She reports sleeping well. She does not have additional problems to discuss today.    Most recent fall risk assessment:    11/29/2021    8:33 AM  Fall Risk   Falls in the past year? 0  Number falls in past yr: 0  Injury with Fall? 0  Risk for fall due to : No Fall Risks  Follow up Falls evaluation completed     Most recent depression screenings:    11/29/2021    8:36 AM 05/29/2021    9:08 AM  PHQ 2/9 Scores  PHQ - 2 Score 0 0  PHQ- 9 Score 0     Vision:Within last year and Dental: No current dental problems and Receives regular dental care  Patient Active Problem List   Diagnosis Date Noted   Elevated blood pressure reading 12/10/2022   Acute viral conjunctivitis of both eyes 01/22/2022   Overweight 11/30/2021   Primary hypertension 11/29/2021   Vitamin D insufficiency 11/29/2021   Elevated TSH 11/29/2021   Melanoma of skin (HCC) 10/25/2021   Dyspareunia in female 06/06/2021   Osteopenia 05/18/2021   Frequent headaches 03/21/2021   Paresthesias 03/21/2021   Post-menopausal 03/21/2021   Dizziness 03/21/2021   Encounter for long-term (current) use of other medications 03/13/2021   Hot flashes due to menopause 03/13/2021   Dyslipidemia (high LDL; low HDL) 03/13/2021   Endocervical adenocarcinoma (HCC) 03/27/2019   Endometrial cancer (HCC) 03/27/2019   Family history of malignant neoplasm of pancreas 11/28/2017   Family history of breast cancer 11/12/2017   Cervical high risk Smith papillomavirus (HPV) DNA test positive 10/02/2017   History of recurrent UTIs 07/20/2015   Low  grade squamous intraepithelial lesion on cytologic smear of cervix (LGSIL) 04/12/2015   Past Medical History:  Diagnosis Date   Cancer (HCC)    Past Surgical History:  Procedure Laterality Date   ABDOMINAL HYSTERECTOMY     CESAREAN SECTION     Family Status  Relation Name Status   Mother  (Not Specified)   Father  (Not Specified)   Mat Aunt  (Not Specified)   Brother  (Not Specified)   Family History  Problem Relation Age of Onset   Hypertension Mother    Hypertension Father    Breast cancer Maternal Aunt    Skin cancer Brother    Allergies  Allergen Reactions   Tylenol [Acetaminophen] Anaphylaxis      Patient Care Team: Nolene Ebbs as PCP - General (Family Medicine)   Outpatient Medications Prior to Visit  Medication Sig   cetirizine (ZYRTEC) 10 MG tablet Take by mouth.   conjugated estrogens (PREMARIN) 0.625 MG/GM vaginal cream Place vaginally.   CRANBERRY PO Take by mouth daily.   VITAMIN D PO Take by mouth.   [DISCONTINUED] venlafaxine XR (EFFEXOR-XR) 75 MG 24 hr capsule TAKE 1 CAPSULE BY MOUTH DAILY WITH BREAKFAST.   No facility-administered medications prior to visit.    ROS   See HPI.      Objective:     BP (!) 142/84   Pulse 60  Ht 4\' 11"  (1.499 m)   Wt 137 lb (62.1 kg)   SpO2 96%   BMI 27.67 kg/m  BP Readings from Last 3 Encounters:  12/10/22 (!) 142/84  01/22/22 131/86  11/29/21 137/69   Wt Readings from Last 3 Encounters:  12/10/22 137 lb (62.1 kg)  01/22/22 125 lb (56.7 kg)  11/29/21 132 lb (59.9 kg)      Physical Exam  BP (!) 142/84   Pulse 60   Ht 4\' 11"  (1.499 m)   Wt 137 lb (62.1 kg)   SpO2 96%   BMI 27.67 kg/m   General Appearance:    Alert, cooperative, no distress, appears stated age  Head:    Normocephalic, without obvious abnormality, atraumatic  Eyes:    PERRL, conjunctiva/corneas clear, EOM's intact, fundi    benign, both eyes  Ears:    Normal TM's and external ear canals, both ears  Nose:    Nares normal, septum midline, mucosa normal, no drainage    or sinus tenderness  Throat:   Lips, mucosa, and tongue normal; teeth and gums normal  Neck:   Supple, symmetrical, trachea midline, no adenopathy;    thyroid:  no enlargement/tenderness/nodules; no carotid   bruit or JVD  Back:     Symmetric, no curvature, ROM normal, no CVA tenderness  Lungs:     Clear to auscultation bilaterally, respirations unlabored  Chest Wall:    No tenderness or deformity   Heart:    Regular rate and rhythm, S1 and S2 normal, no murmur, rub   or gallop     Abdomen:     Soft, non-tender, bowel sounds active all four quadrants,    no masses, no organomegaly        Extremities:   Extremities normal, atraumatic, no cyanosis or edema  Pulses:   2+ and symmetric all extremities  Skin:   Skin color, texture, turgor normal, no rashes or lesions  Lymph nodes:   Cervical, supraclavicular, and axillary nodes normal  Neurologic:   CNII-XII intact, normal strength, sensation and reflexes    throughout      Assessment & Plan:    Routine Health Maintenance and Physical Exam  Immunization History  Administered Date(s) Administered   PFIZER(Purple Top)SARS-COV-2 Vaccination 09/14/2019, 10/16/2019, 05/21/2020   Tdap 12/10/2022    Health Maintenance  Topic Date Due   Hepatitis C Screening  Never done   Fecal DNA (Cologuard)  Never done   COVID-19 Vaccine (4 - 2023-24 season) 12/26/2022 (Originally 03/02/2022)   Zoster Vaccines- Shingrix (1 of 2) 03/12/2023 (Originally 06/03/1979)   HIV Screening  12/10/2023 (Originally 06/03/1975)   INFLUENZA VACCINE  01/31/2023   PAP SMEAR-Modifier  09/12/2023   MAMMOGRAM  09/18/2024   DTaP/Tdap/Td (2 - Td or Tdap) 12/09/2032   HPV VACCINES  Aged Out    Discussed health benefits of physical activity, and encouraged her to engage in regular exercise appropriate for her age and condition.  Kathleen Smith was seen today for annual exam.  Diagnoses and all orders for this  visit:  Routine physical examination -     VITAMIN D 25 Hydroxy (Vit-D Deficiency, Fractures) -     Lipid Panel w/reflex Direct LDL -     Cologuard -     Hepatitis C Antibody -     COMPLETE METABOLIC PANEL WITH GFR -     TSH  Post-menopausal -     VITAMIN D 25 Hydroxy (Vit-D Deficiency, Fractures)  Colon cancer screening -  Cologuard  Encounter for hepatitis C screening test for low risk patient -     Hepatitis C Antibody  Screening for lipid disorders -     Lipid Panel w/reflex Direct LDL  Screening for diabetes mellitus -     COMPLETE METABOLIC PANEL WITH GFR -     Insulin, Free (Bioactive)  Need for Tdap vaccination -     Tdap vaccine greater than or equal to 7yo IM  Hot flashes due to menopause -     venlafaxine XR (EFFEXOR-XR) 75 MG 24 hr capsule; Take 1 capsule (75 mg total) by mouth daily with breakfast.  Elevated blood pressure reading   .Kathleen Kitchen Discussed 150 minutes of exercise a week.  Encouraged vitamin D 1000 units and Calcium 1300mg  or 4 servings of dairy a day.  Fasting labs ordered today Cologuard ordered Mammogram UTD Pap not indicated PHQ no concerns Effexor refilled for hot flashes Discussed Veozah for hot flashes Tdap given Declined shingles/covid vaccine Discussed elevated BP and patient wants to try lifestyle changes first She will start checking at home Follow up in 6 months     Return in about 6 months (around 06/11/2023) for BP recheck.     Tandy Gaw, PA-C

## 2022-12-10 NOTE — Patient Instructions (Signed)
Fezolinetant Tablets What is this medication? FEZOLINETANT (FEZ oh LIN e tant) reduces the number and severity of hot flashes due to menopause. It works by blocking substances in your body that cause hot flashes and night sweats. This medicine may be used for other purposes; ask your health care provider or pharmacist if you have questions. COMMON BRAND NAME(S): VEOZAH What should I tell my care team before I take this medication? They need to know if you have any of these conditions: Kidney disease Liver disease An unusual or allergic reaction to fezolinetant, other medications, foods, dyes, or preservatives Pregnant or trying to get pregnant Breastfeeding How should I use this medication? Take this medication by mouth with water. Take it as directed on the prescription label at the same time every day. Do not cut, crush, or chew this medication. Swallow the tablets whole. You can take it with or without food. If it upsets your stomach, take it with food. Keep taking it unless your care team tells you to stop. Talk to your care team about the use of this medication in children. Special care may be needed. Overdosage: If you think you have taken too much of this medicine contact a poison control center or emergency room at once. NOTE: This medicine is only for you. Do not share this medicine with others. What if I miss a dose? If you miss a dose, take it as soon as you can unless it is more than 12 hours late. If it is more than 12 hours late, skip the missed dose. Take the next dose at the normal time. What may interact with this medication? Other medications may affect the way this medication works. Talk with your care team about all of the medications you take. They may suggest changes to your treatment plan to lower the risk of side effects and to make sure your medications work as intended. This list may not describe all possible interactions. Give your health care provider a list of all  the medicines, herbs, non-prescription drugs, or dietary supplements you use. Also tell them if you smoke, drink alcohol, or use illegal drugs. Some items may interact with your medicine. What should I watch for while using this medication? Visit your care team for regular checks on your progress. Tell your care team if your symptoms do not start to get better or if they get worse. You may need blood work while taking this medication. What side effects may I notice from receiving this medication? Side effects that you should report to your care team as soon as possible: Allergic reactions--skin rash, itching, hives, swelling of the face, lips, tongue, or throat Liver injury--right upper belly pain, loss of appetite, nausea, light-colored stool, dark yellow or brown urine, yellowing skin or eyes, unusual weakness or fatigue Side effects that usually do not require medical attention (report these to your care team if they continue or are bothersome): Back pain Diarrhea Hot flashes Stomach pain Trouble sleeping This list may not describe all possible side effects. Call your doctor for medical advice about side effects. You may report side effects to FDA at 1-800-FDA-1088. Where should I keep my medication? Keep out of the reach of children and pets. Store at room temperature between 20 and 25 degrees C (68 and 77 degrees F). Get rid of any unused medication after the expiration date. To get rid of medications that are no longer needed or have expired: Take the medication to a medication take-back program. Check with   your pharmacy or law enforcement to find a location. If you cannot return the medication, check the label or package insert to see if the medication should be thrown out in the garbage or flushed down the toilet. If you are not sure, ask your care team. If it is safe to put it in the trash, take the medication out of the container. Mix the medication with cat litter, dirt, coffee  grounds, or other unwanted substance. Seal the mixture in a bag or container. Put it in the trash. NOTE: This sheet is a summary. It may not cover all possible information. If you have questions about this medicine, talk to your doctor, pharmacist, or health care provider.  2024 Elsevier/Gold Standard (2021-11-23 00:00:00)   Health Maintenance, Female Adopting a healthy lifestyle and getting preventive care are important in promoting health and wellness. Ask your health care provider about: The right schedule for you to have regular tests and exams. Things you can do on your own to prevent diseases and keep yourself healthy. What should I know about diet, weight, and exercise? Eat a healthy diet  Eat a diet that includes plenty of vegetables, fruits, low-fat dairy products, and lean protein. Do not eat a lot of foods that are high in solid fats, added sugars, or sodium. Maintain a healthy weight Body mass index (BMI) is used to identify weight problems. It estimates body fat based on height and weight. Your health care provider can help determine your BMI and help you achieve or maintain a healthy weight. Get regular exercise Get regular exercise. This is one of the most important things you can do for your health. Most adults should: Exercise for at least 150 minutes each week. The exercise should increase your heart rate and make you sweat (moderate-intensity exercise). Do strengthening exercises at least twice a week. This is in addition to the moderate-intensity exercise. Spend less time sitting. Even light physical activity can be beneficial. Watch cholesterol and blood lipids Have your blood tested for lipids and cholesterol at 63 years of age, then have this test every 5 years. Have your cholesterol levels checked more often if: Your lipid or cholesterol levels are high. You are older than 63 years of age. You are at high risk for heart disease. What should I know about cancer  screening? Depending on your health history and family history, you may need to have cancer screening at various ages. This may include screening for: Breast cancer. Cervical cancer. Colorectal cancer. Skin cancer. Lung cancer. What should I know about heart disease, diabetes, and high blood pressure? Blood pressure and heart disease High blood pressure causes heart disease and increases the risk of stroke. This is more likely to develop in people who have high blood pressure readings or are overweight. Have your blood pressure checked: Every 3-5 years if you are 58-55 years of age. Every year if you are 68 years old or older. Diabetes Have regular diabetes screenings. This checks your fasting blood sugar level. Have the screening done: Once every three years after age 13 if you are at a normal weight and have a low risk for diabetes. More often and at a younger age if you are overweight or have a high risk for diabetes. What should I know about preventing infection? Hepatitis B If you have a higher risk for hepatitis B, you should be screened for this virus. Talk with your health care provider to find out if you are at risk for hepatitis B  infection. Hepatitis C Testing is recommended for: Everyone born from [birth year] through 1966. Anyone with known risk factors for hepatitis C. Sexually transmitted infections (STIs) Get screened for STIs, including gonorrhea and chlamydia, if: You are sexually active and are younger than 63 years of age. You are older than 63 years of age and your health care provider tells you that you are at risk for this type of infection. Your sexual activity has changed since you were last screened, and you are at increased risk for chlamydia or gonorrhea. Ask your health care provider if you are at risk. Ask your health care provider about whether you are at high risk for HIV. Your health care provider may recommend a prescription medicine to help prevent HIV  infection. If you choose to take medicine to prevent HIV, you should first get tested for HIV. You should then be tested every 3 months for as long as you are taking the medicine. Pregnancy If you are about to stop having your period (premenopausal) and you may become pregnant, seek counseling before you get pregnant. Take 400 to 800 micrograms (mcg) of folic acid every day if you become pregnant. Ask for birth control (contraception) if you want to prevent pregnancy. Osteoporosis and menopause Osteoporosis is a disease in which the bones lose minerals and strength with aging. This can result in bone fractures. If you are 64 years old or older, or if you are at risk for osteoporosis and fractures, ask your health care provider if you should: Be screened for bone loss. Take a calcium or vitamin D supplement to lower your risk of fractures. Be given hormone replacement therapy (HRT) to treat symptoms of menopause. Follow these instructions at home: Alcohol use Do not drink alcohol if: Your health care provider tells you not to drink. You are pregnant, may be pregnant, or are planning to become pregnant. If you drink alcohol: Limit how much you have to: 0-1 drink a day. Know how much alcohol is in your drink. In the [place] one drink equals one 12 oz bottle of beer (355 mL), one 5 oz glass of wine (148 mL), or one 1 oz glass of hard liquor (44 mL). Lifestyle Do not use any products that contain nicotine or tobacco. These products include cigarettes, chewing tobacco, and vaping devices, such as e-cigarettes. If you need help quitting, ask your health care provider. Do not use street drugs. Do not share needles. Ask your health care provider for help if you need support or information about quitting drugs. General instructions Schedule regular health, dental, and eye exams. Stay current with your vaccines. Tell your health care provider if: You often feel depressed. You have ever been abused  or do not feel safe at home. Summary Adopting a healthy lifestyle and getting preventive care are important in promoting health and wellness. Follow your health care provider's instructions about healthy diet, exercising, and getting tested or screened for diseases. Follow your health care provider's instructions on monitoring your cholesterol and blood pressure. This information is not intended to replace advice given to you by your health care provider. Make sure you discuss any questions you have with your health care provider. Document Revised: 11/07/2020 Document Reviewed: 11/07/2020 Elsevier Patient Education  2024 [employer].

## 2022-12-11 DIAGNOSIS — E559 Vitamin D deficiency, unspecified: Secondary | ICD-10-CM | POA: Diagnosis not present

## 2022-12-11 DIAGNOSIS — Z13220 Encounter for screening for lipoid disorders: Secondary | ICD-10-CM | POA: Diagnosis not present

## 2022-12-11 DIAGNOSIS — Z1159 Encounter for screening for other viral diseases: Secondary | ICD-10-CM | POA: Diagnosis not present

## 2022-12-11 DIAGNOSIS — Z131 Encounter for screening for diabetes mellitus: Secondary | ICD-10-CM | POA: Diagnosis not present

## 2022-12-12 LAB — LIPID PANEL W/REFLEX DIRECT LDL
Cholesterol: 252 mg/dL — ABNORMAL HIGH (ref <200)
HDL: 51 mg/dL (ref >=50)
LDL Cholesterol (Calc): 173 mg/dL (calc) — ABNORMAL HIGH

## 2022-12-12 LAB — COMPLETE METABOLIC PANEL WITH GFR
AG Ratio: 2 (calc) (ref 1.0–2.5)
Alkaline phosphatase (APISO): 85 U/L (ref 37–153)
BUN: 16 mg/dL (ref 7–25)
Creat: 0.52 mg/dL (ref 0.50–1.05)
Globulin: 2.2 g/dL (calc) (ref 1.9–3.7)
Sodium: 139 mmol/L (ref 135–146)
Total Protein: 6.5 g/dL (ref 6.1–8.1)

## 2022-12-12 LAB — VITAMIN D 25 HYDROXY (VIT D DEFICIENCY, FRACTURES): Vit D, 25-Hydroxy: 25 ng/mL — ABNORMAL LOW (ref 30–100)

## 2022-12-12 LAB — HEPATITIS C ANTIBODY: Hepatitis C Ab: NONREACTIVE

## 2022-12-12 NOTE — Progress Notes (Signed)
Anavi,   Vitamin D still low. Increase vitamin D by 2000 units daily. Take with dairy.  Fasting glucose up a little.  Thyroid looks good.  LDL up quite a bit from last year.   10 year CV risk is 6 percent coupled with LDL173. Medication would be suggested. Did you have a lot of diet changes this year?   Marland KitchenMarland KitchenThe 10-year ASCVD risk score (Arnett DK, et al., 2019) is: 6%   Values used to calculate the score:     Age: 62 years     Sex: Female     Is Non-Hispanic African American: No     Diabetic: No     Tobacco smoker: No     Systolic Blood Pressure: 142 mmHg     Is BP treated: No     HDL Cholesterol: 51 mg/dL     Total Cholesterol: 252 mg/dL

## 2022-12-25 LAB — COMPLETE METABOLIC PANEL WITH GFR
ALT: 16 U/L (ref 6–29)
AST: 18 U/L (ref 10–35)
Albumin: 4.3 g/dL (ref 3.6–5.1)
CO2: 26 mmol/L (ref 20–32)
Calcium: 9.8 mg/dL (ref 8.6–10.4)
Chloride: 103 mmol/L (ref 98–110)
Glucose, Bld: 102 mg/dL — ABNORMAL HIGH (ref 65–99)
Potassium: 4.1 mmol/L (ref 3.5–5.3)
Total Bilirubin: 1.2 mg/dL (ref 0.2–1.2)
eGFR: 105 mL/min/{1.73_m2} (ref >=60)

## 2022-12-25 LAB — LIPID PANEL W/REFLEX DIRECT LDL
Non-HDL Cholesterol (Calc): 201 mg/dL (calc) — ABNORMAL HIGH (ref <130)
Total CHOL/HDL Ratio: 4.9 (calc) (ref <5.0)
Triglycerides: 139 mg/dL (ref <150)

## 2022-12-25 LAB — TSH: TSH: 0.41 mIU/L (ref 0.40–4.50)

## 2022-12-25 LAB — INSULIN, FREE (BIOACTIVE): Insulin, Free: 4.1 u[IU]/mL (ref 1.5–14.9)

## 2022-12-25 NOTE — Progress Notes (Signed)
Free insulin in normal range.

## 2023-02-06 DIAGNOSIS — N941 Unspecified dyspareunia: Secondary | ICD-10-CM | POA: Diagnosis not present

## 2023-02-06 DIAGNOSIS — Z8542 Personal history of malignant neoplasm of other parts of uterus: Secondary | ICD-10-CM | POA: Diagnosis not present

## 2023-03-27 DIAGNOSIS — L578 Other skin changes due to chronic exposure to nonionizing radiation: Secondary | ICD-10-CM | POA: Diagnosis not present

## 2023-03-27 DIAGNOSIS — Z85820 Personal history of malignant melanoma of skin: Secondary | ICD-10-CM | POA: Diagnosis not present

## 2023-03-27 DIAGNOSIS — L821 Other seborrheic keratosis: Secondary | ICD-10-CM | POA: Diagnosis not present

## 2023-06-11 ENCOUNTER — Ambulatory Visit: Payer: BC Managed Care – PPO | Admitting: Physician Assistant

## 2023-07-05 ENCOUNTER — Ambulatory Visit: Payer: BC Managed Care – PPO | Admitting: Physician Assistant

## 2023-08-26 ENCOUNTER — Ambulatory Visit: Payer: BC Managed Care – PPO | Admitting: Physician Assistant

## 2023-09-04 DIAGNOSIS — Z8542 Personal history of malignant neoplasm of other parts of uterus: Secondary | ICD-10-CM | POA: Diagnosis not present

## 2023-09-12 ENCOUNTER — Other Ambulatory Visit: Payer: Self-pay | Admitting: Physician Assistant

## 2023-09-12 DIAGNOSIS — N951 Menopausal and female climacteric states: Secondary | ICD-10-CM

## 2023-09-25 ENCOUNTER — Ambulatory Visit: Payer: BC Managed Care – PPO | Admitting: Physician Assistant

## 2023-09-25 VITALS — BP 172/79 | HR 84 | Ht 59.0 in | Wt 137.0 lb

## 2023-09-25 DIAGNOSIS — R030 Elevated blood-pressure reading, without diagnosis of hypertension: Secondary | ICD-10-CM

## 2023-09-25 DIAGNOSIS — N951 Menopausal and female climacteric states: Secondary | ICD-10-CM

## 2023-09-25 DIAGNOSIS — N952 Postmenopausal atrophic vaginitis: Secondary | ICD-10-CM

## 2023-09-25 DIAGNOSIS — M25551 Pain in right hip: Secondary | ICD-10-CM | POA: Insufficient documentation

## 2023-09-25 DIAGNOSIS — I10 Essential (primary) hypertension: Secondary | ICD-10-CM | POA: Diagnosis not present

## 2023-09-25 DIAGNOSIS — E663 Overweight: Secondary | ICD-10-CM

## 2023-09-25 DIAGNOSIS — M25552 Pain in left hip: Secondary | ICD-10-CM

## 2023-09-25 MED ORDER — PREMARIN 0.625 MG/GM VA CREA: 1.0000 | TOPICAL_CREAM | Freq: Every day | VAGINAL | 12 refills | Status: AC

## 2023-09-25 MED ORDER — VENLAFAXINE HCL ER 75 MG PO CP24: 75.0000 mg | ORAL_CAPSULE | Freq: Every day | ORAL | 1 refills | Status: DC

## 2023-09-25 MED ORDER — HYDROCHLOROTHIAZIDE 12.5 MG PO TABS: 12.5000 mg | ORAL_TABLET | Freq: Every day | ORAL | 1 refills | Status: DC

## 2023-09-25 NOTE — Patient Instructions (Signed)

## 2023-09-25 NOTE — Progress Notes (Unsigned)
   Established Patient Office Visit  Subjective   Patient ID: Kathleen Smith, female    DOB: December 04, 1960  Age: 63 y.o. MRN: [id number]  Chief Complaint  Patient presents with   Medical Management of Chronic Issues    Primary hypertension    HPI Pt is a 63 yo female who presents to the clinic to for follow up on blood pressure and hot flashes.   She is doing ok. She is not taking medication for BP. She is not checking her BP. She is not exercising.   She needs refills on effexor for hot flashes. Doing well.   She is having bilateral hip pain. Feels achy. At times will radiate down lateral leg. Not tried anything to make better.    ROS See HPI.    Objective:     BP (!) 172/79   Pulse 84   Ht 4\' 11"  (1.499 m)   Wt 137 lb (62.1 kg)   SpO2 99%   BMI 27.67 kg/m  BP Readings from Last 3 Encounters:  09/25/23 (!) 172/79  12/10/22 (!) 142/84  01/22/22 131/86   Wt Readings from Last 3 Encounters:  09/25/23 137 lb (62.1 kg)  12/10/22 137 lb (62.1 kg)  01/22/22 125 lb (56.7 kg)      Physical Exam Constitutional:      Appearance: Normal appearance.  HENT:     Head: Normocephalic.  Cardiovascular:     Rate and Rhythm: Normal rate and regular rhythm.  Pulmonary:     Effort: Pulmonary effort is normal.     Breath sounds: Normal breath sounds.  Musculoskeletal:     Right lower leg: No edema.     Left lower leg: No edema.     Comments: No pain to palpation over lumbar spine or bilateral greater trochanters.   Neurological:     General: No focal deficit present.     Mental Status: She is alert and oriented to person, place, and time.  Psychiatric:        Mood and Affect: Mood normal.       The 10-year ASCVD risk score (Arnett DK, et al., 2019) is: 12.8%    Assessment & Plan:  Marland KitchenMarland KitchenBobbye was seen today for medical management of chronic issues.  Diagnoses and all orders for this visit:  Elevated blood pressure reading -     BMP8+eGFR  Hot flashes due to  menopause -     venlafaxine XR (EFFEXOR-XR) 75 MG 24 hr capsule; Take 1 capsule (75 mg total) by mouth daily with breakfast.  Bilateral hip pain -     BMP8+eGFR -     C-reactive protein -     DG HIP UNILAT W OR W/O PELVIS 2-3 VIEWS LEFT; Future -     DG HIP UNILAT W OR W/O PELVIS 2-3 VIEWS RIGHT; Future  Primary hypertension -     hydrochlorothiazide (HYDRODIURIL) 12.5 MG tablet; Take 1 tablet (12.5 mg total) by mouth daily.  Atrophic vaginitis -     conjugated estrogens (PREMARIN) vaginal cream; Place 1 Applicatorful vaginally daily.  Overweight   BP not to goal Not checking home BP regularly need to start Start hydrochlorothiazide Follow up in 4 weeks  Refilled effexor and premarin  Xray ordered of bilateral hips Sounds like arthritis Consider ibuprofen as needed for pain     Return in about 1 month (around 10/26/2023) for Follow up of start HCTZ.    Tandy Gaw, PA-C

## 2023-09-27 ENCOUNTER — Encounter: Payer: Self-pay | Admitting: Physician Assistant

## 2023-09-27 DIAGNOSIS — N952 Postmenopausal atrophic vaginitis: Secondary | ICD-10-CM | POA: Insufficient documentation

## 2023-10-23 ENCOUNTER — Ambulatory Visit: Admitting: Physician Assistant

## 2023-11-04 ENCOUNTER — Ambulatory Visit

## 2023-11-04 DIAGNOSIS — M25552 Pain in left hip: Secondary | ICD-10-CM

## 2023-11-04 DIAGNOSIS — M25551 Pain in right hip: Secondary | ICD-10-CM

## 2023-11-04 DIAGNOSIS — M1611 Unilateral primary osteoarthritis, right hip: Secondary | ICD-10-CM | POA: Diagnosis not present

## 2023-11-04 DIAGNOSIS — R030 Elevated blood-pressure reading, without diagnosis of hypertension: Secondary | ICD-10-CM | POA: Diagnosis not present

## 2023-11-05 ENCOUNTER — Encounter: Payer: Self-pay | Admitting: Physician Assistant

## 2023-11-05 LAB — BMP8+EGFR
BUN/Creatinine Ratio: 22 (ref 12–28)
BUN: 13 mg/dL (ref 8–27)
CO2: 24 mmol/L (ref 20–29)
Calcium: 9.5 mg/dL (ref 8.7–10.3)
Chloride: 101 mmol/L (ref 96–106)
Creatinine, Ser: 0.59 mg/dL (ref 0.57–1.00)
Glucose: 101 mg/dL — ABNORMAL HIGH (ref 70–99)
Potassium: 4.3 mmol/L (ref 3.5–5.2)
Sodium: 139 mmol/L (ref 134–144)
eGFR: 101 mL/min/1.73

## 2023-11-05 LAB — C-REACTIVE PROTEIN: CRP: <1 mg/L (ref 0–10)

## 2023-11-05 NOTE — Progress Notes (Signed)
 Karlisa,   CRP normal and low. Labs look good.

## 2023-11-12 ENCOUNTER — Ambulatory Visit: Admitting: Physician Assistant

## 2023-11-13 ENCOUNTER — Ambulatory Visit: Payer: Self-pay | Admitting: Physician Assistant

## 2023-11-13 ENCOUNTER — Encounter: Payer: Self-pay | Admitting: Physician Assistant

## 2023-11-13 DIAGNOSIS — M461 Sacroiliitis, not elsewhere classified: Secondary | ICD-10-CM | POA: Insufficient documentation

## 2023-11-13 NOTE — Progress Notes (Signed)
 Bilateral SI joint arthritis. Anti-inflammatories and SI joint exercise can be helpful. Consider follow up with Dr. Elva Hamburger to address more advanced treatment plan.

## 2023-11-19 ENCOUNTER — Ambulatory Visit: Admitting: Physician Assistant

## 2023-11-19 ENCOUNTER — Encounter: Payer: Self-pay | Admitting: Physician Assistant

## 2023-11-19 VITALS — BP 130/80 | HR 74 | Ht 59.0 in | Wt 136.0 lb

## 2023-11-19 DIAGNOSIS — M19042 Primary osteoarthritis, left hand: Secondary | ICD-10-CM | POA: Diagnosis not present

## 2023-11-19 DIAGNOSIS — M19041 Primary osteoarthritis, right hand: Secondary | ICD-10-CM | POA: Diagnosis not present

## 2023-11-19 DIAGNOSIS — M79671 Pain in right foot: Secondary | ICD-10-CM | POA: Diagnosis not present

## 2023-11-19 DIAGNOSIS — R030 Elevated blood-pressure reading, without diagnosis of hypertension: Secondary | ICD-10-CM | POA: Insufficient documentation

## 2023-11-19 DIAGNOSIS — M151 Heberden's nodes (with arthropathy): Secondary | ICD-10-CM

## 2023-11-19 MED ORDER — DICLOFENAC SODIUM 1 % EX GEL
4.0000 g | Freq: Four times a day (QID) | CUTANEOUS | 1 refills | Status: AC
Start: 2023-11-19 — End: ?

## 2023-11-19 NOTE — Patient Instructions (Addendum)
 Tumeric 500mg  twice a day.    Joint Pain  Joint pain can be caused by many things. It may go away if you follow instructions from your health care provider for taking care of yourself at home. Sometimes, you may need more treatment. Joint pain can be caused by: Bruises at the area of the joint. An injury caused by movements that are repeated. Wear and tear on the joint as you get older. Buildup of uric acid crystals in the joint. This is also called gout. Irritation and swelling of the joint. Types of arthritis. Infections of the joint or of the bone. Your provider may tell you to take pain medicine or wear an elastic bandage, sling, or splint. If your joint pain continues, you may need lab or imaging tests to find the cause of your joint pain. Follow these instructions at home: If you have an elastic bandage, sling, or splint that can be taken off: Wear the bandage, sling, or splint as told by your provider. Take it off only if your provider says you can. Check the skin under and around it every day. Tell your provider if you see problems. Loosen it if your fingers or toes tingle, are numb, or turn cold and blue. Keep it clean and dry. Ask your provider if you should remove it before bathing. If the bandage, sling, or splint is not waterproof: Do not let it get wet. Cover it when you take a bath or shower. Use a cover that does not let any water in. Managing pain, stiffness, and swelling     If told, put ice on the area. If you have an elastic bandage, sling, or splint that you can take off, remove it as told. Put ice in a plastic bag. Place a towel between your skin and the bag. Leave the ice on for 20 minutes, 2-3 times a day. If told, put heat on the area. Do this as often as told. Use the heat source that your provider recommends, such as a moist heat pack or a heating pad. Place a towel between your skin and the heat source. Leave the heat on for 20-30 minutes. If your skin  turns bright red, take off the ice or heat right away to prevent skin damage. The risk of damage is higher if you can't feel pain, heat, or cold. Move your fingers or toes often to reduce stiffness and swelling. Raise the injured area above the level of your heart while you're sitting or lying down. Use a pillow to support the painful area as needed. Activity Rest the painful joint as told. Do not do things that cause pain or make pain worse. Begin exercising or stretching the affected area as told by your provider. Return to normal activities when you are told. Ask what things are safe for you to do. General instructions Take your medicines as told by your provider. Treatment may include medicines for pain and swelling that are taken by mouth or applied to the skin. Do not smoke, vape, or use products with nicotine or tobacco in them. If you need help quitting, talk with your provider. Keep all follow-up visits. Your provider will want to check on your condition. Contact a health care provider if: You have pain that does not get better with medicine. Your joint pain does not improve within 3 days. You have more bruising or swelling. You have a fever. You lose 10 lb (4.5 kg) or more without trying. Get help right away  if: You cannot move the joint. Your fingers or toes tingle, become numb, or turn cold and blue. You have a fever along with a joint that's red, warm, and swollen. This information is not intended to replace advice given to you by your health care provider. Make sure you discuss any questions you have with your health care provider. Document Revised: 03/21/2023 Document Reviewed: 08/31/2022 Elsevier Patient Education  2024 [employer].

## 2023-11-19 NOTE — Progress Notes (Signed)
 Established Patient Office Visit  Subjective   Patient ID: Kathleen Smith, female    DOB: [birth year]  Age: 63 y.o. MRN: [id number]  Chief Complaint  Patient presents with   Medical Management of Chronic Issues    1 mo fup on htn    HPI Pt is a 63 yo female who presents to the clinic to follow up on elevated blood pressure. She never start hydrochlorothiazide . She really wants to control BP on her own. She checks BP at home and runs never any higher than 130/80. She denies any CP, palpitations, headaches, swelling, SOB. She is active but does not exercise like she should. She states BP is "always high when she comes in here."  She does have nodules on DIP joints of fingers and they ache from time to time. She wonders what she can do about this.   She also has months of dorsal right foot pain. She denies any known trauma. No swelling or bruising. No pain with ROM of foot. Pain with pressure on dorsal foot and at times with walking.   .. 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   Family history of breast cancer 11/12/2017   Family history of malignant neoplasm of pancreas 11/28/2017   History of recurrent UTIs 07/20/2015   Low grade squamous intraepithelial lesion on cytologic smear of cervix (LGSIL) 04/12/2015   Hot flashes due to menopause 03/13/2021   Dyslipidemia (high LDL; low HDL) 03/13/2021   Frequent headaches 03/21/2021   Paresthesias 03/21/2021   Post-menopausal 03/21/2021   Dizziness 03/21/2021   Osteopenia 05/18/2021   Melanoma of skin (HCC) 10/25/2021   Endometrial cancer (HCC) 03/27/2019   Dyspareunia in female 06/06/2021   Primary hypertension 11/29/2021   Vitamin D  insufficiency 11/29/2021   Elevated TSH 11/29/2021   Overweight 11/30/2021   Acute viral  conjunctivitis of both eyes 01/22/2022   Elevated blood pressure reading 12/10/2022   Bilateral hip pain 09/25/2023   Atrophic vaginitis 09/27/2023   SI joint arthritis (HCC) 11/13/2023   White coat syndrome without diagnosis of hypertension 11/19/2023   Arthritis of both hands 11/19/2023   Right foot pain 11/22/2023   Heberden's nodes 11/22/2023   Resolved Ambulatory Problems    Diagnosis Date Noted   No Resolved Ambulatory Problems   Past Medical History:  Diagnosis Date   Cancer (HCC)      ROS See HPI.    Objective:     BP 130/80   Pulse 74   Ht 4\' 11"  (1.499 m)   Wt 136 lb (61.7 kg)   SpO2 99%   BMI 27.47 kg/m  BP Readings from Last 3 Encounters:  11/19/23 130/80  09/25/23 (!) 172/79  12/10/22 (!) 142/84   Wt Readings from Last 3 Encounters:  11/19/23 136 lb (61.7 kg)  09/25/23 137 lb (62.1 kg)  12/10/22 137 lb (62.1 kg)      Physical Exam Constitutional:      Appearance: Normal appearance.  HENT:     Head: Normocephalic.  Cardiovascular:     Rate and Rhythm: Normal rate and regular rhythm.  Pulmonary:     Effort: Pulmonary effort is normal.     Breath sounds: Normal breath sounds.  Musculoskeletal:     Right lower leg: No edema.     Left lower leg: No edema.  Comments: Right foot:  No swelling, redness or bruising NROM Pulses 2+ Tenderness over dorsal midfoot up from 2nd metatarsal. No tenderness over achilles, heel or plantar surface  Neurological:     General: No focal deficit present.     Mental Status: She is alert and oriented to person, place, and time.  Psychiatric:        Mood and Affect: Mood normal.      The 10-year ASCVD risk score (Arnett DK, et al., 2019) is: 5.6%    Assessment & Plan:  Kathleen Smith was seen today for medical management of chronic issues.  Diagnoses and all orders for this visit:  White coat syndrome without diagnosis of hypertension  Arthritis of both hands -     diclofenac Sodium (VOLTAREN) 1 %  GEL; Apply 4 g topically 4 (four) times daily. To affected joint.  Right foot pain -     diclofenac Sodium (VOLTAREN) 1 % GEL; Apply 4 g topically 4 (four) times daily. To affected joint. -     DG Foot Complete Right; Future  Heberden's nodes -     diclofenac Sodium (VOLTAREN) 1 % GEL; Apply 4 g topically 4 (four) times daily. To affected joint.   BP better on 2nd recheck Continue to keep low sodium diet and checking BP under 130/80 is great.   Discussed herberdens nodes and arthritis Consider parrafin wax treatments at night Diclofenac gel as needed Hand arthritis exercises given If one joint is particularly painful consider Dr. Elva Smith for injections   Unclear cause of dorsal foot pain Normal exam today except for pinpoint tenderness over dorsal midfoot Will get xray Use diclofenac Wear good supportive shoes Follow up with podiatry if does not resolve or consider offloading first with post op shoe   Return in about 6 months (around 05/21/2024), or if symptoms worsen or fail to improve.    Kathleen Dilday, PA-C

## 2023-11-20 ENCOUNTER — Ambulatory Visit: Admitting: Physician Assistant

## 2023-11-22 ENCOUNTER — Encounter: Payer: Self-pay | Admitting: Physician Assistant

## 2023-11-22 DIAGNOSIS — M79671 Pain in right foot: Secondary | ICD-10-CM | POA: Insufficient documentation

## 2023-11-22 DIAGNOSIS — M151 Heberden's nodes (with arthropathy): Secondary | ICD-10-CM | POA: Insufficient documentation

## 2023-11-29 ENCOUNTER — Ambulatory Visit (HOSPITAL_COMMUNITY)
Admission: RE | Admit: 2023-11-29 | Discharge: 2023-11-29 | Disposition: A | Discharge status: Home / Self Care | Source: Ambulatory Visit | Attending: Physician Assistant | Admitting: Physician Assistant

## 2023-11-29 DIAGNOSIS — M2011 Hallux valgus (acquired), right foot: Secondary | ICD-10-CM | POA: Diagnosis not present

## 2023-11-29 DIAGNOSIS — M79671 Pain in right foot: Secondary | ICD-10-CM | POA: Diagnosis not present

## 2023-11-29 DIAGNOSIS — M19071 Primary osteoarthritis, right ankle and foot: Secondary | ICD-10-CM | POA: Diagnosis not present

## 2023-11-30 ENCOUNTER — Other Ambulatory Visit: Payer: Self-pay | Admitting: Physician Assistant

## 2023-11-30 DIAGNOSIS — I10 Essential (primary) hypertension: Secondary | ICD-10-CM

## 2023-12-02 ENCOUNTER — Ambulatory Visit: Payer: Self-pay | Admitting: Physician Assistant

## 2023-12-02 NOTE — Progress Notes (Signed)
 Arthritis and bone spurring in right foot. Thoughts on referral to podiatrist?

## 2024-03-11 DIAGNOSIS — Z8542 Personal history of malignant neoplasm of other parts of uterus: Secondary | ICD-10-CM | POA: Diagnosis not present

## 2024-04-21 ENCOUNTER — Telehealth: Payer: Self-pay

## 2024-04-21 DIAGNOSIS — Z1211 Encounter for screening for malignant neoplasm of colon: Secondary | ICD-10-CM

## 2024-04-21 NOTE — Telephone Encounter (Signed)
 Kathleen Smith agreed to have a Cologuard. Order placed.

## 2024-04-29 ENCOUNTER — Other Ambulatory Visit: Payer: Self-pay | Admitting: Physician Assistant

## 2024-04-29 DIAGNOSIS — N951 Menopausal and female climacteric states: Secondary | ICD-10-CM

## 2024-05-20 ENCOUNTER — Ambulatory Visit: Admitting: Physician Assistant

## 2024-05-25 ENCOUNTER — Other Ambulatory Visit: Payer: Self-pay | Admitting: Physician Assistant

## 2024-05-25 DIAGNOSIS — N951 Menopausal and female climacteric states: Secondary | ICD-10-CM

## 2024-06-02 ENCOUNTER — Ambulatory Visit: Admitting: Physician Assistant

## 2024-06-15 ENCOUNTER — Ambulatory Visit
Admission: EM | Admit: 2024-06-15 | Discharge: 2024-06-15 | Disposition: A | Discharge status: Home / Self Care | Attending: Family Medicine | Admitting: Family Medicine

## 2024-06-15 ENCOUNTER — Other Ambulatory Visit: Payer: Self-pay

## 2024-06-15 DIAGNOSIS — R059 Cough, unspecified: Secondary | ICD-10-CM | POA: Diagnosis not present

## 2024-06-15 DIAGNOSIS — J069 Acute upper respiratory infection, unspecified: Secondary | ICD-10-CM | POA: Diagnosis not present

## 2024-06-15 DIAGNOSIS — J309 Allergic rhinitis, unspecified: Secondary | ICD-10-CM | POA: Diagnosis not present

## 2024-06-15 MED ORDER — AMOXICILLIN-POT CLAVULANATE 875-125 MG PO TABS: 1.0000 | ORAL_TABLET | Freq: Two times a day (BID) | ORAL | 0 refills | Status: AC

## 2024-06-15 MED ORDER — FEXOFENADINE HCL 180 MG PO TABS: 180.0000 mg | ORAL_TABLET | Freq: Every day | ORAL | 0 refills | Status: AC

## 2024-06-15 MED ORDER — BENZONATATE 200 MG PO CAPS: 200.0000 mg | ORAL_CAPSULE | Freq: Three times a day (TID) | ORAL | 0 refills | Status: AC | PRN

## 2024-06-15 NOTE — ED Triage Notes (Signed)
 Pt presenting with sore throat, generalized weakness, non productive cough x 5 days. Pt stated that she is now experiencing chest congestion and productive cough with yellowish green sputum. Pt stated that she took Tussin DM yesterday with minimal effectiveness.

## 2024-06-15 NOTE — Discharge Instructions (Addendum)
 Instructed patient to discontinue Zyrtec.  Advised patient take medications as directed with food to completion.  Advised to take Allegra  with first dose of Augmentin  for the next 5 of 7 days.  May use Allegra  as needed afterwards for concurrent postnasal drainage/drip.  Advised may use Tessalon  capsules daily or as needed for cough.  Encouraged to increase daily water intake to 64 ounces per day while taking these medications.

## 2024-06-15 NOTE — ED Provider Notes (Signed)
 Kathleen Smith    CSN: [id number] Arrival date & time: 06/15/24  1058      History   Chief Complaint Chief Complaint  Patient presents with   Cough    HPI Kathleen Smith is a 63 y.o. female.   HPI Very pleasant 63 year old female presents with sore throat, generalized weakness, nonproductive cough for 5 days.  Patient reports now productive cough which is yellowish-green with sputum of the past several days. PMH significant for Endocervical adenocarcinoma, HTN, and dyslipidemia.  Past Medical History:  Diagnosis Date   Cancer [facility])     Patient Active Problem List   Diagnosis Date Noted   Right foot pain 11/22/2023   Heberden's nodes 11/22/2023   White coat syndrome without diagnosis of hypertension 11/19/2023   Arthritis of both hands 11/19/2023   SI joint arthritis 11/13/2023   Atrophic vaginitis 09/27/2023   Bilateral hip pain 09/25/2023   Elevated blood pressure reading 12/10/2022   Acute viral conjunctivitis of both eyes 01/22/2022   Overweight 11/30/2021   Primary hypertension 11/29/2021   Vitamin D  insufficiency 11/29/2021   Elevated TSH 11/29/2021   Melanoma of skin (HCC) 10/25/2021   Dyspareunia in female 06/06/2021   Osteopenia 05/18/2021   Frequent headaches 03/21/2021   Paresthesias 03/21/2021   Post-menopausal 03/21/2021   Dizziness 03/21/2021   Encounter for long-term (current) use of other medications 03/13/2021   Hot flashes due to menopause 03/13/2021   Dyslipidemia (high LDL; low HDL) 03/13/2021   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      Home Medications    Prior to Admission medications  Medication Sig Start Date End Date Taking? Authorizing Provider  amoxicillin -clavulanate (AUGMENTIN ) 875-125 MG tablet Take 1 tablet by mouth every 12 (twelve) hours. 06/15/24  Yes Teddy Sharper, FNP  benzonatate  (TESSALON ) 200 MG capsule Take 1 capsule (200 mg total) by mouth 3 (three) times daily as needed for up to 7 days. 06/15/24 06/22/24 Yes Teddy Sharper, FNP  fexofenadine  (ALLEGRA  ALLERGY) 180 MG tablet Take 1 tablet (180 mg total) by mouth daily. 06/15/24 06/30/24 Yes Teddy Sharper, FNP  conjugated estrogens  (PREMARIN ) 0.625 MG/GM vaginal cream Place vaginally. 02/03/15   [provider]  conjugated estrogens  (PREMARIN ) vaginal cream Place 1 Applicatorful vaginally daily. 09/25/23   Breeback, Jade L, PA-C  CRANBERRY PO Take by mouth daily.    [provider]  diclofenac  Sodium (VOLTAREN ) 1 % GEL Apply 4 g topically 4 (four) times daily. To affected joint. 11/19/23   Breeback, Jade L, PA-C  venlafaxine  XR (EFFEXOR -XR) 75 MG 24 hr capsule TAKE 1 CAPSULE BY MOUTH DAILY WITH BREAKFAST. 05/26/24   Breeback, Jade L, PA-C  VITAMIN D  PO Take by mouth.    [provider]    Family History Family History  Problem Relation Age of Onset   Hypertension Mother    Hypertension Father    Breast cancer Maternal Aunt    Skin cancer Brother     Social History Social  History[1]   Allergies   Tylenol [acetaminophen]   Review of Systems Review of Systems   Physical Exam Triage Vital Signs ED Triage Vitals  Encounter Vitals Group     BP      Girls Systolic BP Percentile      Girls Diastolic BP Percentile      Boys Systolic BP Percentile      Boys Diastolic BP Percentile      Pulse      Resp      Temp      Temp src      SpO2      Weight      Height      Head Circumference       Peak Flow      Pain Score      Pain Loc      Pain Education      Exclude from Growth Chart    No data found.  Updated Vital Signs BP (!) 148/86 (BP Location: Right Arm)   Pulse 66   Temp 98.8 F (37.1 C) (Oral)   Resp 18   Ht 4' 10 (1.473 m)   Wt 135 lb (61.2 kg)   SpO2 96%   BMI 28.22 kg/m      Physical Exam Vitals and nursing note reviewed.  Constitutional:      Appearance: Normal appearance. She is normal weight. She is ill-appearing.  HENT:     Head: Normocephalic and atraumatic.     Right Ear: Tympanic membrane, ear canal and external ear normal.     Left Ear: Tympanic membrane, ear canal and external ear normal.     Mouth/Throat:     Mouth: Mucous membranes are moist.     Pharynx: Oropharynx is clear.     Comments: Significant amount of clear drainage of posterior oropharynx noted Eyes:     Extraocular Movements: Extraocular movements intact.     Conjunctiva/sclera: Conjunctivae normal.     Pupils: Pupils are equal, round, and reactive to light.  Cardiovascular:     Rate and Rhythm: Normal rate and regular rhythm.     Heart sounds: Normal heart sounds.  Pulmonary:     Effort: Pulmonary effort is normal.     Breath sounds: Normal breath sounds. No wheezing, rhonchi or rales.     Comments: Infrequent nonproductive cough on exam Skin:    General: Skin is warm and dry.  Neurological:     General: No focal deficit present.     Mental Status: She is alert and oriented to person, place, and time. Mental status is at baseline.  Psychiatric:        Mood and Affect: Mood normal.        Behavior: Behavior normal.      UC Treatments / Results  Labs (all labs ordered are listed, but only abnormal results are displayed) Labs Reviewed - No data to display  EKG   Radiology No results found.  Procedures Procedures (including critical Smith time)  Medications Ordered in UC Medications - No data to display  Initial Impression / Assessment and Plan / UC  Course  I have reviewed the triage vital signs and the nursing notes.  Pertinent labs & imaging results that were available during my Smith of the patient were reviewed by me and considered in my medical decision making (see chart for details).     MDM: 1.  Acute URI-Rx Augmentin  875/125 mg tablet: Take 1 tablet twice daily x 7  days; 2.  Cough, unspecified type-Rx Tessalon  capsules: Take 1 capsule 3 times daily, as needed for cough; 3.  Allergic rhinitis, unspecified seasonality, unspecified trigger-Rx'd Allegra  180 mg tablet: Take 1 tablet daily x 5 days, then as needed. Instructed patient to discontinue Zyrtec.  Advised patient take medications as directed with food to completion.  Advised to take Allegra  with first dose of Augmentin  for the next 5 of 7 days.  May use Allegra  as needed afterwards for concurrent postnasal drainage/drip.  Advised may use Tessalon  capsules daily or as needed for cough.  Encouraged to increase daily water intake to 64 ounces per day while taking these medications.  Patient discharged home, hemodynamically stable. Final Clinical Impressions(s) / UC Diagnoses   Final diagnoses:  Acute URI  Cough, unspecified type  Allergic rhinitis, unspecified seasonality, unspecified trigger     Discharge Instructions      Instructed patient to discontinue Zyrtec.  Advised patient take medications as directed with food to completion.  Advised to take Allegra  with first dose of Augmentin  for the next 5 of 7 days.  May use Allegra  as needed afterwards for concurrent postnasal drainage/drip.  Advised may use Tessalon  capsules daily or as needed for cough.  Encouraged to increase daily water intake to 64 ounces per day while taking these medications.     ED Prescriptions     Medication Sig Dispense Auth. Provider   amoxicillin -clavulanate (AUGMENTIN ) 875-125 MG tablet Take 1 tablet by mouth every 12 (twelve) hours. 14 tablet Jaymason Ledesma, FNP   benzonatate  (TESSALON ) 200 MG  capsule Take 1 capsule (200 mg total) by mouth 3 (three) times daily as needed for up to 7 days. 40 capsule Beryl Hornberger, FNP   fexofenadine  (ALLEGRA  ALLERGY) 180 MG tablet Take 1 tablet (180 mg total) by mouth daily. 15 tablet Benecio Kluger, FNP      PDMP not reviewed this encounter.    [1]  Social History Tobacco Use   Smoking status: Never   Smokeless tobacco: Never  Vaping Use   Vaping status: Never Used  Substance Use Topics   Alcohol use: Yes    Comment: occ   Drug use: Never     Teddy Sharper, FNP 06/15/24 1217

## 2024-06-17 ENCOUNTER — Encounter: Payer: Self-pay | Admitting: Physician Assistant

## 2024-06-17 ENCOUNTER — Ambulatory Visit: Admitting: Physician Assistant

## 2024-06-17 VITALS — BP 134/84 | HR 65 | Ht 59.0 in | Wt 133.0 lb

## 2024-06-17 DIAGNOSIS — G479 Sleep disorder, unspecified: Secondary | ICD-10-CM | POA: Diagnosis not present

## 2024-06-17 DIAGNOSIS — J069 Acute upper respiratory infection, unspecified: Secondary | ICD-10-CM

## 2024-06-17 DIAGNOSIS — R030 Elevated blood-pressure reading, without diagnosis of hypertension: Secondary | ICD-10-CM

## 2024-06-17 MED ORDER — HYDROCODONE BIT-HOMATROP MBR 5-1.5 MG/5ML PO SOLN: 5.0000 mL | Freq: Three times a day (TID) | ORAL | 0 refills | Status: AC | PRN

## 2024-06-17 NOTE — Progress Notes (Signed)
 Established Patient Office Visit  Subjective   Patient ID: Tascha Selinger, female    DOB: 17-Apr-1961  Age: 63 y.o. MRN: [id number]  Chief Complaint  Patient presents with   Medical Management of Chronic Issues    HPI .SABRADiscussed the use of AI scribe software for clinical note transcription with the patient, who gave verbal consent to proceed.  History of Present Illness Arrin Ishler is a 63 year old female with hypertension who presents for a recheck of elevated blood pressure.  Hypertension - Elevated blood pressure noted at 132/84, which is higher than her usual readings - Discontinued over-the-counter cough syrup containing dextromethorphan due to concern for elevated blood pressure - Currently taking vitamin D  with K2  Upper respiratory symptoms - Seven days of severe sore throat, laryngitis, and postnasal drip - Diagnosed with sinusitis and upper respiratory infection at [facility] - Started antibiotic therapy on Sunday; a few days remain in the course - Taking Allegra  for symptom management - Declined steroid therapy  Cough - Using Tessalon  Perles for cough - Previously used over-the-counter cough syrup containing dextromethorphan, discontinued due to concern for elevated blood pressure  Medication allergies and tolerances - History of anaphylactic reaction to Tylenol, resulting in caution with new medications - Previously tolerated hydrocodone  and codeine without adverse reactions  Sleep disturbance and fatigue - Poor sleep quality attributed to son's sleep schedule; stays awake until she falls asleep and wakes at 5 AM - Active at work and frequently on the go, contributing to fatigue    ROS See HPI.    Objective:     BP 134/84   Pulse 65   Ht 4' 11 (1.499 m)   Wt 133 lb (60.3 kg)   SpO2 99%   BMI 26.86 kg/m  BP Readings from Last 3 Encounters:  06/17/24 134/84  06/15/24 (!) 148/86  11/19/23 130/80   Wt Readings from Last 3 Encounters:  06/17/24  133 lb (60.3 kg)  06/15/24 135 lb (61.2 kg)  11/19/23 136 lb (61.7 kg)      Physical Exam Constitutional:      Appearance: Normal appearance.  HENT:     Head: Normocephalic.     Right Ear: Tympanic membrane, ear canal and external ear normal. There is no impacted cerumen.     Left Ear: Tympanic membrane, ear canal and external ear normal. There is no impacted cerumen.     Nose: Rhinorrhea present. No congestion.     Mouth/Throat:     Mouth: Mucous membranes are moist.     Pharynx: Posterior oropharyngeal erythema present. No oropharyngeal exudate.  Eyes:     Conjunctiva/sclera: Conjunctivae normal.  Cardiovascular:     Rate and Rhythm: Normal rate and regular rhythm.  Pulmonary:     Effort: Pulmonary effort is normal.     Breath sounds: Normal breath sounds.  Musculoskeletal:     Cervical back: Normal range of motion and neck supple. Tenderness present.     Right lower leg: No edema.     Left lower leg: No edema.  Lymphadenopathy:     Cervical: Cervical adenopathy present.  Neurological:     General: No focal deficit present.     Mental Status: She is alert.  Psychiatric:        Mood and Affect: Mood normal.       The 10-year ASCVD risk score (Arnett DK, et al., 2019) is: 6.5%    Assessment & Plan:  SABRASABRAAstou was seen today for medical management of chronic  issues.  Diagnoses and all orders for this visit:  White coat syndrome without diagnosis of hypertension  URI with cough and congestion -     HYDROcodone  bit-homatropine (HYCODAN) 5-1.5 MG/5ML syrup; Take 5 mLs by mouth every 8 (eight) hours as needed for cough.  Trouble in sleeping   Assessment & Plan Acute upper respiratory infection with sinusitis Day seven of symptoms with severe sore throat and laryngitis. No exudate or respiratory distress. Postnasal drip present. Antibiotics started on Sunday. Declined oral steroids but open to Solu-Medrol injection for faster recovery. - Continue antibiotics as  prescribed. - Consider Solu-Medrol injection for inflammation if desired. - Prescribed cough syrup for nighttime use to aid sleep. - Continue Tessalon  Perles during the day.  Sleep disturbance Difficulty sleeping due to caring for her child and early work hours. Possible contribution from vitamin B12 deficiency and sleep apnea. - Recommended oral B12 1000 mcg daily. - Encouraged good sleep hygiene. - Trial of magnesium glycinate 400mg  at bedtime could also be helpful  Suspected vitamin B12 deficiency Concerns about potential vitamin B12 deficiency contributing to fatigue and sleep disturbance. No prior B12 level check. - Recommended oral B12 1000 mcg daily. - Consider checking B12 levels in the future.  Elevated blood pressure without hypertension Blood pressure readings elevated during visit.  - Avoid over-the-counter medications containing decongestants. - 2nd recheck much better - Continue to check BP at home and aim for goal of under 130/80  Follow up in 3 months for CPE and labs.     Jessika Rothery, PA-C

## 2024-06-17 NOTE — Patient Instructions (Addendum)
 Start B12 1000mcg daily.  Cough syrup at bedtime.

## 2024-09-16 ENCOUNTER — Encounter: Admitting: Physician Assistant
